# Patient Record
Sex: Female | Born: 1955 | Race: White | Hispanic: No | Smoking: Never smoker
Health system: Southern US, Community
[De-identification: ages and names within clinical notes are randomized; demographics above are authoritative.]

## PROBLEM LIST (undated history)

## (undated) DIAGNOSIS — R61 Generalized hyperhidrosis: Secondary | ICD-10-CM

## (undated) DIAGNOSIS — Z9889 Other specified postprocedural states: Secondary | ICD-10-CM

## (undated) DIAGNOSIS — Z86718 Personal history of other venous thrombosis and embolism: Secondary | ICD-10-CM

## (undated) DIAGNOSIS — D649 Anemia, unspecified: Secondary | ICD-10-CM

## (undated) DIAGNOSIS — F329 Major depressive disorder, single episode, unspecified: Secondary | ICD-10-CM

## (undated) DIAGNOSIS — K219 Gastro-esophageal reflux disease without esophagitis: Secondary | ICD-10-CM

## (undated) DIAGNOSIS — B009 Herpesviral infection, unspecified: Secondary | ICD-10-CM

## (undated) DIAGNOSIS — S92001A Unspecified fracture of right calcaneus, initial encounter for closed fracture: Secondary | ICD-10-CM

## (undated) DIAGNOSIS — I341 Nonrheumatic mitral (valve) prolapse: Secondary | ICD-10-CM

## (undated) DIAGNOSIS — R5383 Other fatigue: Secondary | ICD-10-CM

## (undated) DIAGNOSIS — R45851 Suicidal ideations: Secondary | ICD-10-CM

## (undated) DIAGNOSIS — R011 Cardiac murmur, unspecified: Secondary | ICD-10-CM

## (undated) DIAGNOSIS — I2699 Other pulmonary embolism without acute cor pulmonale: Secondary | ICD-10-CM

## (undated) DIAGNOSIS — G709 Myoneural disorder, unspecified: Secondary | ICD-10-CM

## (undated) DIAGNOSIS — F32A Depression, unspecified: Secondary | ICD-10-CM

## (undated) DIAGNOSIS — R635 Abnormal weight gain: Secondary | ICD-10-CM

## (undated) DIAGNOSIS — Z460 Encounter for fitting and adjustment of spectacles and contact lenses: Secondary | ICD-10-CM

## (undated) DIAGNOSIS — R112 Nausea with vomiting, unspecified: Secondary | ICD-10-CM

## (undated) HISTORY — DX: Unspecified fracture of right calcaneus, initial encounter for closed fracture: S92.001A

## (undated) HISTORY — DX: Cardiac murmur, unspecified: R01.1

## (undated) HISTORY — DX: Encounter for fitting and adjustment of spectacles and contact lenses: Z46.0

## (undated) HISTORY — DX: Personal history of other venous thrombosis and embolism: Z86.718

## (undated) HISTORY — DX: Morbid (severe) obesity due to excess calories: E66.01

## (undated) HISTORY — DX: Other pulmonary embolism without acute cor pulmonale: I26.99

## (undated) HISTORY — DX: Other fatigue: R53.83

## (undated) HISTORY — DX: Generalized hyperhidrosis: R61

## (undated) HISTORY — DX: Herpesviral infection, unspecified: B00.9

## (undated) HISTORY — DX: Abnormal weight gain: R63.5

## (undated) HISTORY — PX: OTHER SURGICAL HISTORY: SHX169

## (undated) HISTORY — DX: Gastro-esophageal reflux disease without esophagitis: K21.9

---

## 1999-03-02 ENCOUNTER — Other Ambulatory Visit: Admission: RE | Admit: 1999-03-02 | Discharge: 1999-03-02 | Payer: Self-pay | Admitting: *Deleted

## 1999-03-12 ENCOUNTER — Other Ambulatory Visit: Admission: RE | Admit: 1999-03-12 | Discharge: 1999-03-12 | Payer: Self-pay | Admitting: *Deleted

## 1999-03-12 ENCOUNTER — Encounter (INDEPENDENT_AMBULATORY_CARE_PROVIDER_SITE_OTHER): Payer: Self-pay | Admitting: Specialist

## 2000-02-24 ENCOUNTER — Other Ambulatory Visit: Admission: RE | Admit: 2000-02-24 | Discharge: 2000-02-24 | Payer: Self-pay | Admitting: *Deleted

## 2000-04-13 ENCOUNTER — Other Ambulatory Visit: Admission: RE | Admit: 2000-04-13 | Discharge: 2000-04-13 | Payer: Self-pay | Admitting: *Deleted

## 2000-05-02 HISTORY — PX: HYSTEROSCOPY: SHX211

## 2000-05-02 HISTORY — PX: LAPAROSCOPY: SHX197

## 2000-08-25 ENCOUNTER — Other Ambulatory Visit (HOSPITAL_COMMUNITY): Admission: RE | Admit: 2000-08-25 | Discharge: 2000-09-06 | Payer: Self-pay | Admitting: Psychiatry

## 2000-10-09 ENCOUNTER — Other Ambulatory Visit: Admission: RE | Admit: 2000-10-09 | Discharge: 2000-10-09 | Payer: Self-pay | Admitting: *Deleted

## 2001-03-02 HISTORY — PX: EYE SURGERY: SHX253

## 2001-05-02 HISTORY — PX: ABDOMINAL HYSTERECTOMY: SHX81

## 2002-01-02 ENCOUNTER — Other Ambulatory Visit: Admission: RE | Admit: 2002-01-02 | Discharge: 2002-01-02 | Payer: Self-pay | Admitting: *Deleted

## 2002-03-06 ENCOUNTER — Encounter (INDEPENDENT_AMBULATORY_CARE_PROVIDER_SITE_OTHER): Payer: Self-pay | Admitting: Specialist

## 2002-03-06 ENCOUNTER — Observation Stay (HOSPITAL_COMMUNITY): Admission: RE | Admit: 2002-03-06 | Discharge: 2002-03-07 | Payer: Self-pay | Admitting: *Deleted

## 2002-07-19 ENCOUNTER — Emergency Department (HOSPITAL_COMMUNITY): Admission: EM | Admit: 2002-07-19 | Discharge: 2002-07-20 | Payer: Self-pay | Admitting: Emergency Medicine

## 2003-01-17 ENCOUNTER — Encounter: Payer: Self-pay | Admitting: Neurological Surgery

## 2003-01-17 ENCOUNTER — Ambulatory Visit (HOSPITAL_COMMUNITY): Admission: RE | Admit: 2003-01-17 | Discharge: 2003-01-17 | Payer: Self-pay | Admitting: Anesthesiology

## 2003-01-21 ENCOUNTER — Encounter (INDEPENDENT_AMBULATORY_CARE_PROVIDER_SITE_OTHER): Payer: Self-pay | Admitting: *Deleted

## 2003-01-21 ENCOUNTER — Ambulatory Visit (HOSPITAL_COMMUNITY): Admission: RE | Admit: 2003-01-21 | Discharge: 2003-01-21 | Payer: Self-pay | Admitting: Neurological Surgery

## 2003-01-28 ENCOUNTER — Ambulatory Visit (HOSPITAL_COMMUNITY): Admission: RE | Admit: 2003-01-28 | Discharge: 2003-01-28 | Payer: Self-pay | Admitting: Unknown Physician Specialty

## 2003-02-04 ENCOUNTER — Ambulatory Visit (HOSPITAL_COMMUNITY): Admission: RE | Admit: 2003-02-04 | Discharge: 2003-02-04 | Payer: Self-pay | Admitting: Unknown Physician Specialty

## 2003-02-04 ENCOUNTER — Encounter: Payer: Self-pay | Admitting: Unknown Physician Specialty

## 2003-02-11 ENCOUNTER — Ambulatory Visit (HOSPITAL_COMMUNITY): Admission: RE | Admit: 2003-02-11 | Discharge: 2003-02-11 | Payer: Self-pay | Admitting: Neurological Surgery

## 2003-02-11 ENCOUNTER — Encounter: Payer: Self-pay | Admitting: Neurological Surgery

## 2003-02-18 ENCOUNTER — Ambulatory Visit (HOSPITAL_COMMUNITY): Admission: RE | Admit: 2003-02-18 | Discharge: 2003-02-18 | Payer: Self-pay | Admitting: Neurological Surgery

## 2003-02-18 ENCOUNTER — Encounter: Payer: Self-pay | Admitting: Neurological Surgery

## 2003-03-11 ENCOUNTER — Ambulatory Visit (HOSPITAL_COMMUNITY): Admission: RE | Admit: 2003-03-11 | Discharge: 2003-03-11 | Payer: Self-pay | Admitting: Unknown Physician Specialty

## 2003-03-18 ENCOUNTER — Ambulatory Visit (HOSPITAL_COMMUNITY): Admission: RE | Admit: 2003-03-18 | Discharge: 2003-03-18 | Payer: Self-pay | Admitting: Neurological Surgery

## 2004-05-02 DIAGNOSIS — S92001A Unspecified fracture of right calcaneus, initial encounter for closed fracture: Secondary | ICD-10-CM

## 2004-05-02 HISTORY — DX: Unspecified fracture of right calcaneus, initial encounter for closed fracture: S92.001A

## 2004-07-17 ENCOUNTER — Inpatient Hospital Stay (HOSPITAL_COMMUNITY): Admission: RE | Admit: 2004-07-17 | Discharge: 2004-07-19 | Payer: Self-pay | Admitting: Psychiatry

## 2004-07-17 ENCOUNTER — Ambulatory Visit: Payer: Self-pay | Admitting: Psychiatry

## 2005-02-03 ENCOUNTER — Emergency Department (HOSPITAL_COMMUNITY): Admission: EM | Admit: 2005-02-03 | Discharge: 2005-02-03 | Payer: Self-pay | Admitting: *Deleted

## 2005-02-10 ENCOUNTER — Inpatient Hospital Stay (HOSPITAL_COMMUNITY): Admission: RE | Admit: 2005-02-10 | Discharge: 2005-02-13 | Payer: Self-pay | Admitting: Orthopedic Surgery

## 2005-03-07 ENCOUNTER — Inpatient Hospital Stay (HOSPITAL_COMMUNITY): Admission: RE | Admit: 2005-03-07 | Discharge: 2005-03-10 | Payer: Self-pay | Admitting: Orthopedic Surgery

## 2006-04-26 ENCOUNTER — Other Ambulatory Visit: Admission: RE | Admit: 2006-04-26 | Discharge: 2006-04-26 | Payer: Self-pay | Admitting: *Deleted

## 2007-05-03 DIAGNOSIS — I2699 Other pulmonary embolism without acute cor pulmonale: Secondary | ICD-10-CM

## 2007-05-03 HISTORY — DX: Other pulmonary embolism without acute cor pulmonale: I26.99

## 2007-05-09 ENCOUNTER — Other Ambulatory Visit: Admission: RE | Admit: 2007-05-09 | Discharge: 2007-05-09 | Payer: Self-pay | Admitting: *Deleted

## 2008-02-21 ENCOUNTER — Telehealth: Payer: Self-pay | Admitting: Gastroenterology

## 2008-02-23 ENCOUNTER — Inpatient Hospital Stay (HOSPITAL_COMMUNITY): Admission: EM | Admit: 2008-02-23 | Discharge: 2008-02-27 | Payer: Self-pay | Admitting: Emergency Medicine

## 2008-02-23 ENCOUNTER — Ambulatory Visit: Payer: Self-pay | Admitting: *Deleted

## 2008-02-23 ENCOUNTER — Ambulatory Visit: Payer: Self-pay | Admitting: Internal Medicine

## 2008-02-25 ENCOUNTER — Ambulatory Visit: Payer: Self-pay | Admitting: Vascular Surgery

## 2008-02-25 ENCOUNTER — Encounter (INDEPENDENT_AMBULATORY_CARE_PROVIDER_SITE_OTHER): Payer: Self-pay | Admitting: Internal Medicine

## 2008-02-29 ENCOUNTER — Emergency Department (HOSPITAL_COMMUNITY): Admission: EM | Admit: 2008-02-29 | Discharge: 2008-02-29 | Payer: Self-pay | Admitting: Emergency Medicine

## 2008-08-18 ENCOUNTER — Encounter: Admission: RE | Admit: 2008-08-18 | Discharge: 2008-08-18 | Payer: Self-pay | Admitting: Family Medicine

## 2010-09-14 NOTE — Consult Note (Signed)
NAMEDAYSHIA, [name]:  [id number]   MEDICAL RECORD NO.:  [id number]          PATIENT TYPE:  INP   LOCATION:  6708                         FACILITY:  [facility]   PHYSICIAN:  Glennie Isle, MD   DATE OF BIRTH:  03/20/1956   DATE OF CONSULTATION:  02/24/2008  DATE OF DISCHARGE:                                 CONSULTATION   PRIMARY DOCTOR:  Dr. Doug Sou.   CHIEF COMPLAINT:  Shortness of breath.   HISTORY OF PRESENT ILLNESS:  The patient is a 56 year old obese female  with a 1-day history of shortness of breath.  She denies any chest pain,  nausea, vomiting, dizziness.  She states that the shortness of breath  started yesterday and worsened later in the afternoon.  In the ER, she  was noted to have an elevated troponin and elevated D-dimer.  She had a  CT PE protocol done demonstrating bilateral extensive pulmonary emboli.  The patient states that she has noted some swelling and edema in her  right lower extremity.  She denies any recent travel.  She does admit to  a sedentary lifestyle given that she does have some problems with her  right ankle.   PAST MEDICAL HISTORY:  1. Mitral valve prolapse.  2. History of PVC 10 years ago controlled with metoprolol.  3. Depression.  4. Hypertension.  5. GERD.  6. History of open reduction internal fixation of the right posterior      calcaneus in November 2006.   ALLERGIES:  No known drug allergies.   MEDICATIONS:  Metoprolol.  Cymbalta.  Omeprazole.  Wellbutrin.  Xanax.  Mirtazapine.   SOCIAL HISTORY:  Denies any tobacco, alcohol.  She states that she  smoked when she was ages 68 through 67.   FAMILY HISTORY:  Unknown.  The patient is adopted.   REVIEW OF SYSTEMS:  A thorough 14-point review of systems was performed  and was negative, except as noted per HPI.   PHYSICAL EXAM:  VITAL SIGNS:  Temperature 98.3, pulse 91, respiratory  rate 20.  Blood pressure 135/55, satting 91% on 3L nasal cannula.  GENERAL APPEARANCE:  In no apparent distress.  The patient is on nasal  cannula.  HEENT:  Pupils equal, round, and reactive to light.  Extraocular  movements intact.  Oropharynx clear.  NECK:  Large, though supple and no thyromegaly, JVD, or bruits  appreciated.  CARDIOVASCULAR:  S1, S2 normal.  No murmurs or rubs heard.  LUNGS:  Clear to auscultation bilaterally.  No crackles are heard.  SKIN:  There is a mild right ankle erythema.  ABDOMEN:  Soft, nontender, nondistended with bowel sounds.  The patient  is obese.  EXTREMITIES:  The patient has 1 to 2+ bilateral edema, left greater than  right.  There is mild right ankle tenderness to palpation.  MUSCULOSKELETAL:  There are no joint deformities noted.  NEUROLOGIC:  Alert and oriented x3.  Cranial nerves 2-12 are intact.  Motor exam is 5/5 upper extremity and lower extremity.   RADIOLOGY:  CT PE protocol showed extensive bilateral PE with areas of  pulmonary infarction.  The patient has a 3.2 mm left upper lobe nodule.   EKG was pending.   LABS:  Show a white count of 15.1, hemoglobin 11.9, platelets 316,000.  BMP showed a BUN of 8, creatinine 1.02, with a potassium of 3.9.  INR  was 1.0.  D-dimer was elevated at 2.62.  BNP was 297.  CK-MB was 4.7,  troponin 0.67 up from point of care troponin of 0.21.   ASSESSMENT AND PLAN:  Elevated troponin.  This is most likely secondary  to bilateral pulmonary emboli rather than acute myocardial infarction.  I do not suspect an unstable plaque.  The patient does not really have  any significant risk factors, although there is a question of  hypertension, but the patient does deny.  I agree with primary team's  assessment with an echocardiogram in the morning.  Will check a TSH as  well.  I do expect to see the cardiac enzymes trend down.  Would  continue heparin, Coumadin, and initiate aspirin 81 mg daily, and check  an EKG.  Thank you for this consultation.      Glennie Isle, MD   Electronically Signed     SS/MEDQ  D:  02/24/2008  T:  02/24/2008  Job:  [id number]

## 2010-09-14 NOTE — H&P (Signed)
[name], [name]:  [id number]   MEDICAL RECORD NO.:  [id number]          PATIENT TYPE:  INP   LOCATION:  6708                         FACILITY:  [facility]   PHYSICIAN:  Joylene John, MD       DATE OF BIRTH:  [birth year]   DATE OF ADMISSION:  02/23/2008  DATE OF DISCHARGE:                              HISTORY & PHYSICAL   CHIEF COMPLAINT:  This is a 55 year old obese white female coming in  with shortness of breath for about 1 day.   HISTORY OF PRESENT ILLNESS:  A 55 year old obese white female  complaining of shortness of breath, which started yesterday afternoon.  According to the patient, she was walking from one room to another and  experienced shortness of breath.  According to the patient, this was  very unusual for her.  Over the course of the day, her shortness of  breath got worse and at night when she was sitting in a chair, she had a  squeezing sensation in her chest resulting in difficulty breathing.  The  patient denied any chest pain, nausea, vomiting, or dizziness associated  with her shortness of breath, however, did report some chills yesterday.  The patient denied any cough or hemoptysis also.  The patient called the  PCP this morning and was told to come to the emergency room for further  management.  The patient's first set of cardiac enzymes showed elevated  troponin and elevated D-dimers.  The patient had a CT of chest, which  showed extensive bilateral PE and patchy areas of atelectasis reflective  of pulmonary infarction.  There was also a left upper lobe nodule, 3.2  mm which was noted on chest CT.  The patient denies taking any oral  contraceptives or any recent travels requiring hours of sitting.  The  patient did notice that earlier this week on Wednesday she had some  redness and swelling on her right lower extremity at the site of her  surgical scar.  The patient told me that she applied some hydrocortisone  cream to relieve the  redness and the discomfort.  The patient denies any  similar episodes of lower extremity skin or leg swelling in the past.  The patient is up to date with her Pap, mammogram, and colonoscopy.   PAST MEDICAL HISTORY:  Significant for anxiety, depression, obesity, and  hypertension.   SOCIAL HISTORY:  The patient is a nonsmoker and nondrinker.  No drug  use.   FAMILY HISTORY:  Unknown since the patient is adopted.   The patient's home medications include the following:  1. Metoprolol XL 50 mg once daily.  2. Cymbalta 60 mg once daily.  3. Prilosec 20 mg once daily.  4. Wellbutrin 300 mg once daily.  5. Remeron 45 mg once daily.  6. Xanax 1 mg 4 times a day.   The patient's allergies are listed as no known allergies   REVIEW OF SYSTEMS:  Please refer to the HPI, otherwise the 14-point  review of system was negative.   PHYSICAL EXAMINATION:  VITAL SIGNS:  Temperature of 98.3, blood pressure  of 135/55, pulse of 91, and respirations 20.  The patient was sating  about 91-92% on 3 liters of nasal cannula.  GENERAL:  This is an obese white female in no acute distress, able to  talk in full sentences, awake and alert.  HEENT:  No oral lesions noted.  NECK:  Supple.  No lymph nodes are appreciated.  Sclera is anicteric.  LUNGS:  Clear to auscultation.  CARDIOVASCULAR:  Regular rate and rhythm.  No crackles heard at the  bases.  LOWER EXTREMITY:  With minimal right lower extremity redness of the skin  on the lateral malleolus.  No other findings on physical exam.   LABORATORY DATA:  Lab values show pertinent values of white count of  15.1, hemoglobin 11.9, hematocrit 37.9, and platelets 316.  First set of  cardiac enzymes showed a troponin I of 0.28 which was elevated and CK-MB  of 7.8.  Chemistry showed a sodium of 138, potassium 3.9, chloride 102,  bicarb 26, BUN 8, creatinine 1.02, and glucose 119.  UA was cloudy with  trace leukocyte esterase, rest was negative.  CT of the chest  showed  extensive bilateral pulmonary embolus and a patchy area for atelectasis  reflective of pulmonary infarct and a left upper lobe nodule which was  3.2 mm, which requires a followup CT in 1 year.   ASSESSMENT AND PLAN:  A 55 year old obese white female coming in with  shortness of breath for 1 day, found to have extensive pulmonary  embolism on CT.  Plan is to admit the patient to telemetry and do serial  cardiac enzymes to rule her out since her first set of troponin was  elevated, although this could be explained by extensive pulmonary  embolism.  Heparin drip and Coumadin for anticoagulation per pharmacy  protocol.  We will get an echocardiogram in the morning.  We will get  blood cultures and urine cultures since the patient has increased white  count to make sure we are not missing a fever source, although the white  count could also be explained by the stress from the pulmonary embolism.  The patient will be ready for discharge when she is stable and her INR  is therapeutic.  One possibility for early discharge is the patient is  stable and INR is not therapeutic as that she can be discharged on subcu  Lovenox as a bridging therapy with INR which can be monitored outpatient  mid-week by the PCP.  The patient's husband is a diabetic and is used to  giving insulin shots and so he can be willing to be trained to give  Lovenox shot if the patient is ready for discharge over the weekend.      Joylene John, MD  Electronically Signed     RP/MEDQ  D:  02/23/2008  T:  02/24/2008  Job:  [id number]

## 2010-09-17 NOTE — Op Note (Signed)
NAMEKAMARRI, FISCHETTI NO.:  [id number]   MEDICAL RECORD NO.:  [id number]          PATIENT TYPE:  INP   LOCATION:  5016                         FACILITY:  [facility]   PHYSICIAN:  Nadara Mustard, MD     DATE OF BIRTH:  [birth year]   DATE OF PROCEDURE:  03/07/2005  DATE OF DISCHARGE:                                 OPERATIVE REPORT   PREOPERATIVE DIAGNOSIS:  Wound breakdown, right posterior calcaneus, status  post open reduction and internal fixation of calcaneus with osteomyelitis  and failure of fixation.   POSTOPERATIVE DIAGNOSIS:  Wound breakdown, right posterior calcaneus, status  post open reduction and internal fixation of calcaneus with osteomyelitis  and failure of fixation.   PROCEDURE:  1.  Partial calcaneal excision.  2.  Irrigation and debridement of skin, soft tissue and bone.  3.  Gastrocnemius recession.  4.  Advancement of the Achilles tendon.  5.  Z-plasty wound closure.  6.  Application of wound vacuum-assisted closure.   SURGEON:  Nadara Mustard, MD   ANESTHESIA:  General.   ESTIMATED BLOOD LOSS:  Minimal.   ANTIBIOTICS:  One gram of Kefzol.   DRAINS:  None.   COMPLICATIONS:  None.   TOURNIQUET TIME:  None.   DISPOSITION:  To PACU in stable condition.   INDICATION FOR PROCEDURE:  The patient is a 55 year old woman who is 4 weeks  status post ORIF of a tongue-type calcaneus fracture through an extensile  lateral incision.  Initially postoperatively, the patient's incision had  healed nicely and the fracture was stable; however, she had progressive loss  of reduction with elevation of the calcaneal fragment.  This fragment caused  skin breakdown and necrosis posteriorly and the patient had a 50 x 50-mm  area of wound necrosis posteriorly from the failure of fixation.  Her  lateral incision had healed nicely.  She is 3 weeks out from her initial  fixation and presents at this time for revision internal fixation as  mentioned above.   Risks and benefits were discussed including persistent  infection, neurovascular injury, nonhealing of the wound, need for  additional surgery.  The patient states she understands and wishes to  proceed at this time.   DESCRIPTION OF PROCEDURE:  The patient was brought to OR room 4 and  underwent a general anesthetic.  After an adequate level of anesthesia was  obtained, the patient was placed prone on the operating table and all bony  prominences were padded and the patient's right lower extremity was first  scrubbed with Betadine scrub and paint; this was dried and then she was  prepped into a sterile field using DuraPrep.  The 50 x 50-mm area of skin  necrosis was debrided posteriorly.  The deep retained hardware was removed  and the calcaneal fragment was then excised.  The wound was irrigated with  normal saline; there were good viable wound edges.  A proximal incision was  then made and a gastrocnemius recession was performed.  This allowed for  advancement of the Achilles.  The Achilles was advanced into the calcaneus  and this  was secured using #2 FilterWire.  With the posterior incision, a Z-  plasty closure was performed and the wound edges were approximated with the  foot plantarflexed with an Allgower suture of 2-0 nylon with no sutures  crossing the posterior flap.  With the foot in plantarflexion, the leg was  covered with Ioban dressing.  This was then opened over the incision,  Adaptic was applied and a wound V.A.C. was applied on top.  The wound V.A.C.  was set to 75 mm of suction and Adaptic and a dry dressing with 4 x 4's and  Coban were applied to the gastrocnemius recession.  The patient was then  extubated and taken to the PACU in stable condition.  Plan for a wound  V.A.C. therapy, antibiotics and discharge to home once the wound shows good  healing.      Nadara Mustard, MD  Electronically Signed     MVD/MEDQ  D:  03/07/2005  T:  03/08/2005  Job:  [id number]

## 2010-09-17 NOTE — Op Note (Signed)
NAMEJALENE, [name]:  [id number]   MEDICAL RECORD NO.:  [id number]                   PATIENT TYPE:  AMB   LOCATION:  DAY                                  [facility]ACILITY:  [facility]   PHYSICIAN:  Almedia Balls. Fore, M.D.                DATE OF BIRTH:  [birth year]   DATE OF PROCEDURE:  03/06/2002  DATE OF DISCHARGE:                                 OPERATIVE REPORT   PREOPERATIVE DIAGNOSES:  1. Abnormal uterine bleeding.  2. Anemia.  3. History of endometriosis.   POSTOPERATIVE DIAGNOSES:  1. Abnormal uterine bleeding.  2. Anemia.  3. History of endometriosis.   PROCEDURE:  Abdominal supracervical hysterectomy.   SURGEON:  Almedia Balls. Randell Patient, M.D.   ASSISTANT:  Leona Singleton, M.D.   ANESTHESIA:  General orotracheal.   INDICATIONS:  The patient is a 55 year old with the above noted problems who  was counseled as to the need for surgery to treat these problems and the  type of surgery to be performed.  She was fully counseled as to the nature  of the procedure and the risks involved to include risks of anesthesia,  injury to bowel, bladder, blood vessels, ureters, postoperative hemorrhage,  infection, recuperation and possible use of hormone therapy should her  ovaries be removed.  She fully understands all of these considerations and  wishes to proceed on March 06, 2002.   FINDINGS:  On entry into the abdomen, there were some adhesions involving  the omentum to the anterior peritoneal services, particularly in the right  upper quadrant around the liver and gallbladder.  Palpation of the upper  abdominal viscera revealed it to be all normal.  The appendix appeared  normal.  The uterus was mid posterior and top normal size and somewhat soft.  The tubes had been previously surgically interrupted for sterilization  attempt.  The ovaries appeared to be normal.   DESCRIPTION OF PROCEDURE:  With the patient under general anesthesia,  prepared and draped in  the usual sterile fashion, with the Foley catheter in  the bladder, a lower abdominal transverse incision was made after excising  the previous surgical scar.  The incision was continued into the peritoneal  cavity without difficulty.  Self-retaining retractor was placed, and the  bowel was packed off.  Kelly clamps were used to clamp the utero-ovarian  anastomosis, tubes and round ligaments bilaterally for traction and  hemostasis.  The round ligaments were then transected with Bovie  electrocoagulation with development of the bladder flap anteriorly and entry  into the retroperitoneal and paravesical spaces.  Because of the normal  appearance of the ovaries it was felt that these could be retained.  Accordingly, Haney clamps were placed proximal to the ovaries bilaterally  with transection of the utero-ovarian anastomosis and tubes.  The pedicles  were then doubly tied with 1 chromic catgut.  The uterine vessels were  skeletonized bilaterally,  clamped, cut and suture ligated with 1 chromic  catgut.  Cardinal ligaments were likewise clamped bilaterally, cut, and  suture ligated with 1 chromic catgut.  It is impossible to excise the  uterine fundus using Bovie electrocoagulation.  The cervix was also removed.  The cervical stump was reapproximated and rendered hemostatic with  interrupted figure-of-eight sutures of #1 chromic catgut.  The area was then  lavaged with copious amounts of lactated Ringers solution, and after noting  that hemostasis was maintained, the area was reperitonealized with  interrupted suture of 1 chromic catgut.  The ovaries were well elevated out  of the pelvis, so suspension of these structures was not necessary.  At this  point, with correct sponge and instrument count and good hemostasis, the  peritoneum was closed with continuous suture of 0 Vicryl.  The fascia was  closed with two sutures of 0 Vicryl which were brought from the lateral  aspects of the incision  and tied separately in the midline.  Subcutaneous  fat was reapproximated with interrupted sutures of 0 Vicryl.  The skin was  closed with a subcuticular suture of 3-0 plain catgut.  Estimated blood loss  150 ml.  The patient was taken to the recovery room in good condition with  clear urine in the Foley catheter tubing.  She will be placed on 23 hour  observation following surgery.                                               Almedia Balls. Randell Patient, M.D.    SRF/MEDQ  D:  03/06/2002  T:  03/06/2002  Job:  [id number]   cc:   Leona Singleton, M.D.  [address]  [place]  [place] [address]  Fax: [phone]

## 2010-09-17 NOTE — Discharge Summary (Signed)
NAMEMAEKAYLA, [name]:  [id number]   MEDICAL RECORD NO.:  [id number]          PATIENT TYPE:  INP   LOCATION:  5023                         FACILITY:  [facility]   PHYSICIAN:  Nadara Mustard, MD     DATE OF BIRTH:  1955-06-16   DATE OF ADMISSION:  02/10/2005  DATE OF DISCHARGE:  02/13/2005                                 DISCHARGE SUMMARY   DIAGNOSIS:  Tongue-type right calcaneal fracture.   PROCEDURE:  Open reduction/internal fixation right calcaneus. Discharged to  home in stable condition. Prescription for Tylox. Plan to follow-up in the  office and 1-2 weeks.   HISTORY OF PRESENT ILLNESS:  The patient is a 55 year old woman who  sustained a tongue-type right calcaneus fracture. The patient was  stabilized. The patient was placed in a compressive wrap to decrease the  swelling and she presents at this time for internal fixation. The patient's  hospital course was essentially unremarkable. She underwent ORIF of the  right calcaneus fracture. She received Kefzol for infection prophylaxis and  a tourniquet was not used.   Postoperatively, the patient was started on physical therapy with  progressive ambulation, nonweightbearing on the right. She was started on  aspirin for DVT prophylaxis. The patient progressed well with ambulation.  She was safe with ambulation with nonweightbearing on the right and she was  discharged to home in stable condition on February 13, 2005 with follow-up in  the office in 2 weeks. She was to continue with her aspirin for DVT  prophylaxis and Tylox for pain.      Nadara Mustard, MD  Electronically Signed     MVD/MEDQ  D:  04/13/2005  T:  04/13/2005  Job:  [id number]

## 2010-09-17 NOTE — H&P (Signed)
[name], [name]:  [id number]   MEDICAL RECORD NO.:  [id number]          PATIENT TYPE:  IPS   LOCATION:  0506                          FACILITY:  [facility]   PHYSICIAN:  Geoffery Lyons, M.D.      DATE OF BIRTH:  09-06-1955   DATE OF ADMISSION:  07/17/2004  DATE OF DISCHARGE:                         PSYCHIATRIC ADMISSION ASSESSMENT   IDENTIFYING INFORMATION:  This is a 55 year old married white female.  She  is admitted voluntarily to the services of Dr. Geoffery Lyons.   HISTORY OF PRESENT ILLNESS:  Apparently the patient lost her job on Friday,  the day before had been the 4th anniversary of her father's death, and she  had not been sleeping well this past week due to the upcoming anniversary of  her father's death and then being let go.  She emailed a friend and  indicated that she was going to say goodbye to her sons.  This friend  happens to work for a crisis line and called the police.  The police showed  up at her home yesterday, and she volunteered to come here to the behavioral  health center for clearance.  She states that she is not actually suicidal.  He husband was concerned that she had not conveyed to him exactly how upset  she was about losing her job etc.   PAST PSYCHIATRIC HISTORY:  Four years ago after the sudden death of her  father, the patient did go to IOP here for 2 weeks.  She found it quite  helpful.  She has been a patient of Dr. [name] since then.   SOCIAL HISTORY:  This is her third marriage.  She has been married to this  husband 5 years now.  Her second marriage was the love of her life.  They  were together for 7-8 years; however, he left the marriage for someone who  was about 10 years younger than the patient.  She has two sons, ages 57 and  61.  She states this is the third time that she has been laid off in about 7-  8 years.   FAMILY HISTORY:  The patient is adopted.   ALCOHOL OR DRUG HISTORY:  None.   MEDICAL  HISTORY AND PRIMARY CARE [name]:  Dr. Catha Gosselin, and she is  prescribed no medications by him.  She is followed by Dr. [name]  psychiatrically.  Medical problems are her obesity and insomnia.   MEDICATIONS:  1.  Sonata 20 mg at h.s.  2.  Klonopin 2 mg at h.s.  3.  Cymbalta 60 mg at h.s.  4.  Nexium 40 mg p.o. daily  5.  Toprol XL 50 mg daily for arrhythmia.   DRUG ALLERGIES:  She is allergic to [facility], but I am not sure what  happens.   PHYSICAL EXAMINATION:  She is obese, otherwise, her physical examination was  unremarkable.   MENTAL STATUS EXAM:  She is alert and oriented x 3.  She is appropriately  groomed, dressed and nourished.  Her speech was not pressured.  Her mood is  somewhat depressed  and anxious.  Her affect is congruent; however, it is  appropriate to the situation.  Thought processes are clear, rational and  goal-oriented.  She is requesting discharge, feels that she just needs to go  to a family therapist to communicate better with her current husband.  Judgment and insight seem to be intact.  Concentration and memory are  intact.  Intelligence is at least average.  She denies suicide or homicidal  ideation.  She denies auditory or visual hallucinations.   ADMISSION DIAGNOSES:   AXIS I:  Major depressive disorder, recurrent.   AXIS II:  Deferred.   AXIS III:  1.  History of pseudotumor, right eye; however, this has been ruled out by      Dr. Ermalene Searing at [facility], a neuro-ophthalmologist  2.  She is status post hysterectomy 3 years ago.   AXIS IV:  Occupational problem, just laid off again.   AXIS V:  35.   PLAN:  Continue her medications.  She works quite closely with Dr. [name].  Indeed she has an upcoming appointment regarding her medications, and  we will have case management try to identify an appropriate family and  marital therapist.   DIAGNOSTIC STUDIES:  Her labs show that she has a slight infection.  WBC is  15,000.   Her urinalysis is pending.  Her glucose is slightly elevated at  100, and her alkaline phosphatase is also slightly elevated at 172.  Other  tests are pending.  However, her TSH is available, and it is within range at  4.070.  Once we get the results of her UA, if she needs treatment we will  treat that.      MD/MEDQ  D:  07/18/2004  T:  07/18/2004  Job:  [id number]

## 2010-09-17 NOTE — Discharge Summary (Signed)
[name], SCHIFANO.:  [id number]   MEDICAL RECORD NO.:  [id number]          PATIENT TYPE:  INP   LOCATION:  6708                         FACILITY:  [facility]   PHYSICIAN:  Ramiro Harvest, MD    DATE OF BIRTH:  [birth year]   DATE OF ADMISSION:  02/23/2008  DATE OF DISCHARGE:  02/27/2008                               DISCHARGE SUMMARY   PATIENT'S PRIMARY CARE PHYSICIAN:  Dr. Catha Gosselin of [facility].   DISCHARGE DIAGNOSES:  1. Bilateral pulmonary embolism/left lower extremity deep venous      thrombosis/right superficial thrombus.  2. Elevated troponins.  3. Leukocytosis.  4. Depression/anxiety.  5. Hypertension.  6. Obesity.   DISCHARGE MEDICATIONS:  1. Coumadin 5 mg p.o. q.h.s.  2. Metoprolol ER 50 mg p.o. daily.  3. Cymbalta 60 mg p.o. daily.  4. Omeprazole 20 mg p.o. daily.  5. Wellbutrin 300 mg p.o. daily.  6. Remeron 45 mg p.o. q.h.s.  7. Xanax 1 mg q.i.d.   DISPOSITION AND FOLLOWUP:  Patient will be discharged home.  Patient is  to follow up with PCP's office on Friday, February 29, 2008, for a PT/INR  check.  Patient is then to follow up with PCP in 1 week for hospital  followup.  Patient will likely need 6 months to a year of  anticoagulation as this is her first episode of a thrombus/embolus.   CONSULTATIONS DONE:  A cardiology consult was done.  Patient was seen in  consultation by Dr. Lewayne Bunting of [facility] on February 24, 2008.   PROCEDURES PERFORMED:  1. A chest x-ray was performed on February 23, 2008, that showed      cardiac enlargement and pulmonary venous congestion and right      middle lobe atelectasis.  2. CT angiogram of the chest was done on February 23 2008, that showed      extensive bilateral pulmonary embolus, patchy areas of atelectasis      and airspace disease likely reflects areas of pulmonary infarction,      left upper lobe nodule measures 3.2 mm.  If the patient is a      smoker, has risk factors  for bronchogenic carcinoma.  Followup      chest CT at a year is recommended.  His recommendation follows the      consensus statement.  3. A 2D echo was obtained on February 25, 2008, which showed normal      overall left ventricular systolic function, EF of 60% to 65%.  4. Lower extremity venous Dopplers were obtained on February 25, 2008,      that showed in the right lower extremity was positive for      superficial thrombus in the lesser saphenous vein and distal      greater saphenous vein.  No evidence of DVT or Baker cyst.  Left      lower extremity was positive for DVT in the popliteal vein.  No      evidence of superficial thrombus or Baker cyst.   BRIEF ADMISSION HISTORY AND PHYSICAL:  [name] is  a 55-year-  old, white, obese female who presented with complaints of shortness of  breath which started 1 day prior to admission.  According to the  patient, she was walking from one room to another and experienced some  shortness of breath.  According to the patient, this was very unusual  for her.  Over the course of the day, her shortness of breath got worse  and at night when she was sitting in the chair she had a squeezing  sensation in her chest resulting in difficulty breathing.  Patient  denied any chest pain.  No nausea.  No vomiting.  No dizziness  associated with her shortness of breath, however, did report some  chills.  Patient denied any cough or hemoptysis.  Patient called the PCP  on the morning of admission and was told to present to the ED for  further evaluation.  First set of cardiac enzymes showed elevated  troponin and elevated D-dimer.  The patient had a CT of the chest done  which showed extensive bilateral pulmonary embolism and patchy areas of  atelectasis reflective of pulmonary infarction.  There was also a left  upper lobe nodule 3.2 mm noted on chest CT.  Patient denies taking any  oral contraceptives or any recent travels requiring hours of  sitting.  Patient did notice early on in the week she had some redness and  swelling in her right lower extremity at the site of her surgical scar.  Patient told the admitting physician that she applied some  hydrocortisone cream to relieve the redness and discomfort.  Patient  denied any similar episodes of lower extremity skin or leg swelling in  the past.  Patient has been up-to-date with her Paps, mammogram, and her  colonoscopy.   PHYSICAL EXAM:  Per admitting physician, temperature 98.3, blood  pressure 135/55, pulse of 91, respirations 20, saturating 91% to 92% on  3 L nasal cannula.  GENERAL:  Patient is obese, white female, no acute distress, able to  speak in full sentences, awake and alert.  HEENT:  Normocephalic, atraumatic.  Pupils equal, round, and reactive to  light and accommodation.  Extraocular movements intact.  Oropharynx was  clear.  No lesions.  No exudates.  NECK:  Supple.  No lymphadenopathy.  Sclerae was anicteric.  LUNGS:  Clear to auscultation bilaterally.  No wheezes, no crackles, no  rhonchi.  CARDIOVASCULAR:  Regular rate and rhythm.  No murmurs, rubs, or gallops.  ABDOMEN:  Soft, nondistended.  Positive bowel sounds.  EXTREMITIES:  Minimal right lower extremity redness of the skin on the  lateral malleolus, otherwise within normal limits.   ADMISSION LABS:  White count 15.1, hemoglobin 11.9, hematocrit 37.9,  platelets of 316.  First set of cardiac enzymes showed a troponin of  0.28 which was elevated and a CK-MB of 7.8, sodium of 138, potassium  3.9, chloride 102, bicarb 26, BUN 8, creatinine 1.02, glucose of 119.  UA was cloudy with trace leukocyte esterase, otherwise was negative.  CT  of the chest as stated above.   HOSPITAL COURSE:  1. Bilateral pulmonary embolism/left lower extremity DVT/right      superficial thrombus.  Patient was admitted for a PE.  Patient was      placed on a heparin drip, as well as started on Coumadin.  Cardiac       enzymes were cycled.  A 2D echo was also obtained with results as      stated above, was negative  for any LV dysfunction or right      ventricular heart strain.  Lower extremity Dopplers were also      obtained with results as stated above which was positive for a left      lower extremity DVT in the popliteal vein.  Patient was continued      on heparin, as well as her Coumadin.  Heparin was then discontinued      and patient was started on  Lovenox bridge twice a day.  Patient      was monitored.  INR was monitored.  INR was therapeutic.  Patient      had been on Lovenox for at least a minimum of 5 days.  Her INR      remained in therapeutic range for 48 hours prior to discharge.      Patient was discharged home on Coumadin without close followup on      Friday February 29, 2008, for INR check and follow up with her PCP.      Patient improved symptomatically throughout the hospitalization and      by day of discharge patient was in stable and improved condition.  2. Elevated troponins.  On admission, patient was noted to have      elevated troponins.  Cardiology was consulted.  It was felt that      patient's elevated troponins were likely secondary to a bilateral      pulmonary embolism.  A 2D echo was obtained with results as stated      above which was within normal limits.  It was felt by cardiology      that no further cardiac workup was needed and patient was      discharged in stable condition.  3. Leukocytosis.  Patient was noted to have a leukocytosis on      admission.  No source of infection was noted.  Patient remained      afebrile throughout the hospitalization.  Urinalysis with urine      cultures were negative.  Chest x-ray was negative for any acute      infiltrate.  Blood cultures were also negative as well and patient      was discharged in stable condition.  The rest of patient's chronic      medical issues were stable throughout the hospitalization and      patient  was discharged in stable and improved condition.   On day of discharge, patient was in stable condition.   VITAL SIGNS ON DAY OF DISCHARGE:  Temperature of 98.3.  Pulse of 84.  Respirations 18.  Blood pressure 111/71.  Saturating 95% on 2 L nasal  cannula.   DISCHARGE LABS:  Sodium of 141, potassium 4.2, chloride 107, bicarb 25,  glucose 103, BUN 8, creatinine 0.93, and a calcium of 8.6, PT of 25.5,  INR of 2.2.  CBC with a white count of 11.6, hemoglobin 11.4, hematocrit  34.1, platelets of 353.   It was a pleasure taking care of [name]      Ramiro Harvest, MD  Electronically Signed     DT/MEDQ  D:  06/16/2008  T:  06/16/2008  Job:  [id number]   cc:   Caryn Bee L. Little, M.D.  Doylene Canning. Ladona Ridgel, MD

## 2010-09-17 NOTE — Op Note (Signed)
NAMETANAKA, GILLEN NO.:  [id number]   MEDICAL RECORD NO.:  [id number]          PATIENT TYPE:  INP   LOCATION:  2899                         FACILITY:  [facility]   PHYSICIAN:  Nadara Mustard, MD     DATE OF BIRTH:  04/09/1956   DATE OF PROCEDURE:  02/10/2005  DATE OF DISCHARGE:                                 OPERATIVE REPORT   PREOP DIAGNOSIS:  Tongue-type right calcaneus fracture.   POSTOP DIAGNOSIS:  Tongue-type right calcaneus fracture.   PROCEDURE:  Open reduction internal fixation right calcaneus fracture.   SURGEON:  Nadara Mustard, MD   ANESTHESIA:  General.   ESTIMATED BLOOD LOSS:  Minimal.   ANTIBIOTICS:  1 gram of Kefzol.   DRAINS:  None.   COMPLICATIONS:  None.   TOURNIQUET TIME:  None.   DISPOSITION:  To PACU in stable condition.   INDICATIONS FOR PROCEDURE:  The patient is a 55 year old woman who was  standing on her bed to change the speed of her ceiling fan when she fell  landing on her right calcaneus. The patient sustained a closed calcaneus  fracture. The patient was placed in a compressive dressing to decrease the  swelling and presents, at this time, for open reduction internal fixation.  Risks and benefits of surgery were discussed including infection,  neurovascular injury, nonhealing of the wound, need for additional surgery.  The patient states that he understands and wishes to proceed at this time.   DESCRIPTION OF PROCEDURE:  The patient was brought to OR room #5 and  underwent a general anesthetic. After an adequate level of anesthesia  obtained, the patient was placed in left lateral decubitus position with the  right side up and the right lower extremity was prepped using DuraPrep,  draped into a sterile field and Ioban was used to cover all exposed skin.  The patient's previous fracture blisters had burst and there was  epithelialization beneath the old fracture blisters.   An extensile lateral incision was made along  the vermilion border just  anterior to the Achilles tendon and curved at the distal aspect. This was  carried sharply down to the calcaneus and subperiosteal dissection was used  to elevate a thick flap. The flap was pulled during the retraction. A clamp  was placed around the tongue-type fracture.  The fracture site was cleansed  and irrigated and the fracture was reduced. Prior to open reduction, a  Steinmann pin was placed posteriorly and attempt at a closed reduction was  performed; and this was unable to be closed reduced. Ioban drapes were used  to cover all exposed skin.   After the fracture was reduced three cortical screws using lag screw  technique were placed from the posterior os calcis anteriorly to secure the  tongue-type fracture. These were 50 mm in length. Radiographs showed stable  alignment after reduction. The wound was irrigated. The subcu was closed  using 2-0 Vicryl. The skin was closed using 2-0 nylon with an Algower suture  and there were no sutures that crossed over the flap skin. The wound was  covered  with Mepitel.  Bactroban cream was applied over the wound and the  epithelialized fracture blisters. The leg was then wrapped in a compressive  Robert Stone's compressive dressing. The patient was extubated, taken to  PACU in stable condition. She will adhere to strict elevation,  nonweightbearing on the right.  Plan to followup in the office, 2 weeks  after discharge.      Nadara Mustard, MD  Electronically Signed     MVD/MEDQ  D:  02/10/2005  T:  02/10/2005  Job:  [id number]

## 2010-09-17 NOTE — Consult Note (Signed)
NAME:  [name], GEDDES NO.:  [id number]   MEDICAL RECORD NO.:  [id number]          PATIENT TYPE:  EMS   LOCATION:  ED                           FACILITY:  [facility]   PHYSICIAN:  Nadara Mustard, MD     DATE OF BIRTH:  07-Jan-1956   DATE OF CONSULTATION:  DATE OF DISCHARGE:                                   CONSULTATION   HISTORY OF PRESENT ILLNESS:  Patient is a 55 year old woman who states that  she was standing on her bed to change the direction of her ceiling fan, when  she fell, landing on her right heel.  Patient complained of immediate right  heel pain.  Patient denies any other injuries.   OBJECTIVE:  Patient does have some bruising on the right knee.  Both lower  extremities are neurovascularly intact.  She has a good dorsalis pedis  pulse.  She has an obvious deformity of the right calcaneus.   Radiograph shows a tongue type right calcaneus fracture.   ASSESSMENT:  Right tongue type calcaneal fracture.   PLAN:  After informed consent and sterile prepping, patient underwent a  hematoma block with 10 cc of 1% lidocaine plain.  She then underwent flexion  of the knee, plantar flexion of the foot, and reduction of the calcaneal  fragment.  She then was wrapped in a bulky soft dressing.  Patient was given  instructions for strict elevation with her foot above her heart.  Nonweightbearing.  She will be given a walker.  She is given a prescription  for Tylox for pain.  Patient states that Vicodin does not help her with her  pain and Tylox is the only pain medication which will help her.  She will  call the office tomorrow for followup in the office on Monday for  anticipated open reduction/internal fixation.   The patient denies a history of tobacco use.  She denies a history of  diabetes.  Denies a history of hypertension.      Nadara Mustard, MD  Electronically Signed     MVD/MEDQ  D:  02/03/2005  T:  02/03/2005  Job:  [id number]

## 2010-09-17 NOTE — H&P (Signed)
[name], [name]:  [id number]   MEDICAL RECORD NO.:  [id number]                   PATIENT TYPE:  AMB   LOCATION:  DAY                                  [facility]ACILITY:  [facility]   PHYSICIAN:  Almedia Balls. Fore, M.D.                DATE OF BIRTH:  [birth year]   DATE OF ADMISSION:  03/06/2002  DATE OF DISCHARGE:                                HISTORY & PHYSICAL   CHIEF COMPLAINT:  Abnormal bleeding, pain, fibroids.   HISTORY OF PRESENT ILLNESS:  The patient is a 55 year old whose last  menstrual period was in September 2003.  She has had over the past several  years increasingly severe problems with her menses in that they are  extremely painful and involve large clots and heavy bleeding.  She has  missed work several days because of these problems.  Examination in  September revealed the uterus to be enlarging and to be approximately [redacted]  weeks gestational size with tenderness at the uterus and ovaries.  The  patient had undergone hysteroscopy, D&C with benign pathology and  laparoscopy in November 2000, with finding of endometriosis.  It was felt  that this particular problem could be recurring.  She is admitted at this  time for hysterectomy, possible bilateral salpingo-oophorectomy, possible  appendectomy.  She has been fully counseled as to the nature of the  procedure and the risks involved to include risks of anesthesia, injury to  bowel, bladder, blood vessels, ureters, postoperative hemorrhage, infection,  recuperation, possibility of hormone replacement should her ovaries be  removed.  She fully understands all these considerations and wishes to  proceed on March 06, 2002.   PAST MEDICAL HISTORY:  1. Laparoscopic sterilization procedure in 1994.  2. History of mitral valve prolapse for which she takes prophylactic     antibiotics.  3. Depression problems for which she is taking Lexapro 20 mg daily.   ALLERGIES:  She is allergic to no  medications.   SOCIAL HISTORY:  She is a nonsmoker, nondrinker.   MEDICATIONS:  1. Toprol XL 50 mg daily.  2. Nexium 40 mg daily.  3. Lexapro 20 mg daily.  4. Klonopin 1 mg 3-4 times a day.  5. Wellbutrin 300 mg daily.  6. Trazodone 150 mg daily.  7. Aspirin 81 mg daily.   FAMILY HISTORY:  None, because the patient is adopted.   REVIEW OF SYSTEMS:  HEENT:  Headaches, felt to be tension in etiology.  CARDIORESPIRATORY:  Heart murmur.  Mitral valve prolapse.  Occasional  irregularity secondary to mitral valve prolapse.  GASTROINTESTINAL:  Constipation to alternate with diarrhea.  GENITOURINARY:  As noted above.  NEUROMUSCULAR:  Negative.   PHYSICAL EXAMINATION:  VITAL SIGNS:  Height 5 feet 7-1/4 inches, weight 252  pounds.  Blood pressure 132/74, respirations 18.  GENERAL:  Well-developed white female in no acute distress.  HEENT:  Within normal limits.  NECK:  Supple.  Without masses, adenopathy, or bruits.  HEART:  Regular rate and rhythm.  With presystolic click in the mitral valve  area.  LUNGS:  Clear to P&A.  BREASTS:  Examined sitting and lying, without mass.  Axilla negative.  ABDOMEN:  Soft.  Without mass.  Somewhat tender in both lower quadrants.  PELVIC:  External genitalia, Bartholin, urethral, and Skene's glands within  normal limits.  Cervix is slightly inflamed.  Uterus is posterior, enlarged,  and somewhat irregular.  Adnexal areas are tender, but no palpable masses  are appreciated.  EXTREMITIES:  Within normal limits.  CNS:  Grossly intact.  SKIN:  Without suspicious lesions.   IMPRESSION:  Abnormal uterine bleeding.  Pelvic pain.  Uterine enlargement.   DISPOSITION:  As noted above.  Of note is that a Pap smear was normal in  September 2003.                                               Almedia Balls. Randell Patient, M.D.    SRF/MEDQ  D:  03/04/2002  T:  03/04/2002  Job:  [id number]

## 2010-09-17 NOTE — Discharge Summary (Signed)
[name], [name]:  [id number]   MEDICAL RECORD NO.:  [id number]          PATIENT TYPE:  IPS   LOCATION:  0506                          FACILITY:  [facility]   PHYSICIAN:  Geoffery Lyons, M.D.      DATE OF BIRTH:  Sep 27, 1955   DATE OF ADMISSION:  07/17/2004  DATE OF DISCHARGE:  07/19/2004                                 DISCHARGE SUMMARY   CHIEF COMPLAINT AND PRESENT ILLNESS:  This was the first admission to [facility] for this 55 year old married white female voluntarily  admitted.  Lost her job a few days prior to this admission.  The day before  had been the fourth anniversary of her father's death.  She had not been  sleeping well this past week due to coming anniversary of her father's death  and then being let go affected her markedly.  She emailed a friend and  indicated that she was going to stay goodbye to her sons.  The friend  happened to work for a crisis line and called the police.  The police showed  up at her home.  She volunteered to come for clearance.  Endorsed that she  was not suicidal.  Her husband was concerned that she had not conveyed to  him exactly how upset about losing her job.   PAST PSYCHIATRIC HISTORY:  Four years prior to this admission, sudden death  of her father. She came to IOP.  Has seen Dr. [name] since then.   ALCOHOL/DRUG HISTORY:  Denies the use or abuse of any substances.   MEDICAL HISTORY:  Insomnia.   MEDICATIONS:  Sonata 20 mg at night, Klonopin 2 mg at night, Cymbalta 60 mg  at night, Nexium 40 mg daily, Toprol XL 50 mg daily for arrhythmia.   PHYSICAL EXAMINATION:  Performed and failed to show any acute findings.   LABORATORY DATA:  CBC with white blood cells 15.0, hematocrit 13.7.  Blood  chemistries with SGOT 22, SGPT 17, glucose 100.  TSH 4.070.  Drug screen  positive for benzodiazepines.   MENTAL STATUS EXAM:  Alert, cooperative female appropriately groomed and  dressed and  nourished.  Speech was normal in rate, tempo and production.  Mood was depressed and anxious.  Affect was congruent.  Thought processes  were logical, rational and coherent.  Requesting discharge.  She felt that  she just needs to go to her family therapist to communicate better with her  current husband.  Judgment and insight were appropriate.  Endorsed no  suicidal or homicidal ideation.  Endorsed that she was upset but says she  would never hurt herself.   ADMISSION DIAGNOSES:   AXIS I:  Major depression, recurrent.   AXIS II:  No diagnosis.   AXIS III:  History of pseudotumor.   AXIS IV:  Moderate.   AXIS V:  Global Assessment of Functioning upon admission 35; highest Global  Assessment of Functioning in the last year 75.   HOSPITAL COURSE:  She was admitted and started in individual and group  psychotherapy.  She was able to settle down.  She was able to open up in  individual and group process.  She was maintained on Sonata 10 mg at night,  trazodone 150 mg at night, Klonopin 1 mg four times a day, Cymbalta 60 mg  daily, Toprol XL 50 mg daily, Protonix 40 mg daily, Seroquel 50 mg every six  hours as needed.  She felt that she was doing well.  She was able to deal  with the grief, the loss of her job and the relationship.  Her mood was  better.  Her affect was bright, broad.  Denied any suicidal or homicidal  ideation.  Was willing and motivated to pursue further outpatient treatment  but felt she was not being helped by being in the unit.   DISCHARGE DIAGNOSES:   AXIS I:  Major depression, recurrent.   AXIS II:  No diagnosis.   AXIS III:  Pseudotumor, right eye.   AXIS IV:  Moderate.   AXIS V:  Global Assessment of Functioning upon discharge 50.   DISCHARGE MEDICATIONS:  1.  Cymbalta 60 mg daily.  2.  Toprol XL 50 mg daily.  3.  Protonix 40 mg daily.  4.  Klonopin 1 mg four times a day.  5.  Restoril 15 mg at night.  6.  Lunesta 3 mg at night as needed.  7.   Seroquel 25 mg, 2-4 tablets as needed for sleep.   FOLLOW UP:  Dr. [name] and Carlus Pavlov.      IL/MEDQ  D:  08/10/2004  T:  08/11/2004  Job:  [id number]

## 2010-09-17 NOTE — Discharge Summary (Signed)
   NAMEJOHANNA, MATTO NO.:  [id number]   MEDICAL RECORD NO.:  [id number]                   PATIENT TYPE:  OBV   LOCATION:  0483                                 [facility]ACILITY[facility]  [facility]   PHYSICIAN:  Almedia Balls. Fore, M.D.                DATE OF BIRTH:  02/26/1956   DATE OF ADMISSION:  03/06/2002  DATE OF DISCHARGE:  03/07/2002                                 DISCHARGE SUMMARY   HISTORY:  The patient is a 55 year old with abnormal uterine bleeding,  pelvic pain, slight uterine enlargement, history of endometriosis for a  hysterectomy and possible bilateral salpingo-oophorectomy on March 06, 2002.  The remainder of her history and physical are as previously dictated.   LABORATORY AND ACCESSORY DATA:  Include preoperative hemoglobin 11.6.   Chest x-ray which showed bronchial changes consistent with previous mild  bronchitis.  Electrocardiogram was normal.   HOSPITAL COURSE:  The patient was taken to the operating room on November  5th at which time abdominal supracervical hysterectomy was performed.  The  patient did well postoperatively.  Diet and ambulation were progressed over  the evening of November 5th and early morning of November 6th.  On the  morning of November 6th, she was afebrile and experiencing no problems  except for pain which was managed with oral analgesics.  It was felt that  she could be discharged at this time.   FINAL DIAGNOSES:  1. Abnormal uterine bleeding.  2. Pelvic pain.  3. Anemia secondary to #1.  4. History of endometriosis.   OPERATION:  Abdominal supracervical hysterectomy.   PATHOLOGY:  Report unavailable at the time of dictation.   DISPOSITION:  Discharged home to return to the office in two weeks for  followup.  She was fully ambulatory, on a regular diet, and in good  condition at the time of discharge.   MEDICATIONS:  She was given prescriptions for Dilaudid generic 2 mg dispense  #30 to be taken 1-2 tablets  q.4 h. p.r.n. pain, doxycycline 100 mg dispense  #12 to be taken one b.i.d., and Reglan 10 mg dispense #15 to be used one  t.i.d.   DISCHARGE INSTRUCTIONS:  She is to call for any problems.                                               Almedia Balls. Randell Patient, M.D.    SRF/MEDQ  D:  03/07/2002  T:  03/07/2002  Job:  [id number]

## 2011-01-31 LAB — BASIC METABOLIC PANEL
BUN: 8
CO2: 25
CO2: 27
Calcium: 8.1 — ABNORMAL LOW
Calcium: 8.4
Calcium: 8.6
Creatinine, Ser: 1.01
GFR calc Af Amer: >60
GFR calc Af Amer: >60
GFR calc Af Amer: >60
GFR calc Af Amer: >60
GFR calc non Af Amer: 58 — ABNORMAL LOW
GFR calc non Af Amer: >60
Glucose, Bld: 109 — ABNORMAL HIGH
Potassium: 4
Sodium: 140
Sodium: 141
Sodium: 143

## 2011-01-31 LAB — URINALYSIS, ROUTINE W REFLEX MICROSCOPIC
Glucose, UA: NEGATIVE
Hgb urine dipstick: NEGATIVE
Ketones, ur: NEGATIVE
Protein, ur: NEGATIVE
Specific Gravity, Urine: 1.008
Urobilinogen, UA: 0.2
pH: 7

## 2011-01-31 LAB — DIFFERENTIAL
Basophils Absolute: 0.1
Basophils Relative: 0
Basophils Relative: 1
Eosinophils Absolute: 0.1
Lymphocytes Relative: 33
Lymphocytes Relative: 34
Lymphocytes Relative: 37
Lymphs Abs: 3.9
Lymphs Abs: 4.8 — ABNORMAL HIGH
Monocytes Absolute: 0.6
Monocytes Absolute: 0.6
Monocytes Relative: 5
Monocytes Relative: 5
Neutro Abs: 10.7 — ABNORMAL HIGH
Neutro Abs: 7
Neutro Abs: 7.3
Neutro Abs: 8 — ABNORMAL HIGH
Neutrophils Relative %: 56

## 2011-01-31 LAB — APTT: aPTT: 97 — ABNORMAL HIGH

## 2011-01-31 LAB — CBC
HCT: 37.1
Hemoglobin: 10.8 — ABNORMAL LOW
Hemoglobin: 10.9 — ABNORMAL LOW
Hemoglobin: 11.4 — ABNORMAL LOW
MCV: 79.4
RBC: 4.15
RBC: 4.34
RBC: 4.67
RDW: 17.2 — ABNORMAL HIGH
RDW: 17.5 — ABNORMAL HIGH
WBC: 11.6 — ABNORMAL HIGH
WBC: 13.1 — ABNORMAL HIGH
WBC: 15.1 — ABNORMAL HIGH

## 2011-01-31 LAB — CK TOTAL AND CKMB (NOT AT ARMC)
CK, MB: 4.7 — ABNORMAL HIGH
Relative Index: 3.2 — ABNORMAL HIGH

## 2011-01-31 LAB — PROTIME-INR
INR: 1.5
Prothrombin Time: 13.5
Prothrombin Time: 24.1 — ABNORMAL HIGH

## 2011-01-31 LAB — CULTURE, BLOOD (ROUTINE X 2): Culture: NO GROWTH

## 2011-01-31 LAB — HEPARIN LEVEL (UNFRACTIONATED)
Heparin Unfractionated: 0.36
Heparin Unfractionated: 0.4

## 2011-01-31 LAB — CARDIAC PANEL(CRET KIN+CKTOT+MB+TROPI)
Relative Index: 1.3
Total CK: 284 — ABNORMAL HIGH
Troponin I: 0.3 — ABNORMAL HIGH
Troponin I: 0.39 — ABNORMAL HIGH

## 2011-01-31 LAB — URINE CULTURE: Colony Count: >=100000

## 2011-01-31 LAB — COMPREHENSIVE METABOLIC PANEL
ALT: 16
Albumin: 3.4 — ABNORMAL LOW
Alkaline Phosphatase: 153 — ABNORMAL HIGH
Chloride: 102
GFR calc non Af Amer: 56 — ABNORMAL LOW
Potassium: 3.9
Sodium: 138
Total Bilirubin: 0.6

## 2011-01-31 LAB — POCT CARDIAC MARKERS
CKMB, poc: 7.8
Troponin i, poc: 0.28 — ABNORMAL HIGH

## 2011-01-31 LAB — URINE MICROSCOPIC-ADD ON

## 2011-01-31 LAB — TROPONIN I: Troponin I: 0.67

## 2011-01-31 LAB — D-DIMER, QUANTITATIVE: D-Dimer, Quant: 2.62 — ABNORMAL HIGH

## 2011-05-12 DIAGNOSIS — H18719 Corneal ectasia, unspecified eye: Secondary | ICD-10-CM | POA: Insufficient documentation

## 2011-05-12 DIAGNOSIS — Q142 Congenital malformation of optic disc: Secondary | ICD-10-CM | POA: Insufficient documentation

## 2011-07-08 ENCOUNTER — Ambulatory Visit (INDEPENDENT_AMBULATORY_CARE_PROVIDER_SITE_OTHER): Payer: Self-pay | Admitting: General Surgery

## 2011-07-14 ENCOUNTER — Ambulatory Visit (INDEPENDENT_AMBULATORY_CARE_PROVIDER_SITE_OTHER): Payer: Medicare Other | Admitting: Surgery

## 2011-07-14 ENCOUNTER — Encounter (INDEPENDENT_AMBULATORY_CARE_PROVIDER_SITE_OTHER): Payer: Self-pay | Admitting: Surgery

## 2011-07-14 NOTE — Progress Notes (Signed)
Re:   [name] DOB:   [birth year] MRN:   [id number]  ASSESSMENT AND PLAN: 1.  Morbid obesity - Weight - 270, BMI - 42.4.  Per the 1991 NIH Consensus Statement, the patient is a candidate for bariatric surgery.  The patient attended our information session and reviewed the different types of bariatric surgery.    The patient is interested in the laparoscopic adjustable gastric band.  I discussed with the patient the indications and risks of lap band surgery.  The potential risks of surgery include, but are not limited to, bleeding, infection, DVT and PE, slippage and erosion of the band, open surgery, and death.  The patient understands the importance of compliance and long term follow-up with our group after surgery.  She was given literature regarding lap band surgery and encouraged to visit their web site, [email or web address], and register.  From here we'll obtain labs, x-rays, nutrition consult, and psych consult.  2.  Pseudotumor cerebri.  3.  History of PE. 2009.  No further problems. 4.  Diabetes.  Non insulin dependent. 5.  Hypertension - She claims to be on meds for protective effect. 6.  Hyperlipidemia. 7.  Husband died last 02/20/11 - acute MI. 8.  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 9.  GERD. 10.  On disability secondary to right heel injury - sees Dr. Lajoyce Corners. 11.  Psoriasis.  Chief Complaint  Patient presents with  . Bariatric Pre-op    Lap band initial   REFERRING PHYSICIAN: Mickie Hillier, MD, MD  HISTORY OF PRESENT ILLNESS: [name] is a 56 y.o. (DOB: Mar 09, 1956)  white female whose primary care physician is Mickie Hillier, MD, MD and comes to me today for bariatric surgery/lap band.  The patient has been overweight much of her adult life. She remembers in high school filling obese. Her mother does not help in that she bothers her repeatedly about her weight.  She has been to an information session. She has tried  Weight Watchers, Nutrisystem, and low calorie diets without success. The best weight she has lost was with Weight Watchers. She's tried some oral medicine for weight loss, but these have not had long-term success.  She is interested in lap band, but is not know anyone who has a LAP-BAND. We talked about going to support group for further information.  No history of stomach disease.  No history of liver disease.  No history of gall bladder disease.  No history of pancreas disease.  No history of colon disease.  She had a colonoscopy and upper endo by Dr. Jarold Motto in 2004.  She says she is due for another exam in 2014.    Past Medical History  Diagnosis Date  . Weight gain   . Night sweats   . Fatigue     due to loss of sleep  . Herpes   . Diabetes mellitus   . GERD (gastroesophageal reflux disease)   . Hx of blood clots   . Contact lens/glasses fitting   . Pulmonary embolism 2009      Past Surgical History  Procedure Date  . Eye surgery 03/2001    lasik  . Hysteroscopy 2002  . Laparoscopy 2002  . Abdominal hysterectomy 2003  . Pseudotumor cerebri       Current Outpatient Prescriptions  Medication Sig Dispense Refill  . ALPRAZolam (XANAX) 1 MG tablet 6 times daily.      Marland Kitchen buPROPion (WELLBUTRIN XL)  150 MG 24 hr tablet Daily.      . CYMBALTA 60 MG capsule Daily.      Marland Kitchen glimepiride (AMARYL) 2 MG tablet Daily.      . halobetasol (ULTRAVATE) 0.05 % ointment Daily.      Marland Kitchen losartan (COZAAR) 50 MG tablet Daily.      . mirtazapine (REMERON) 15 MG tablet At bedtime.      Marland Kitchen omeprazole (PRILOSEC) 20 MG capsule Daily.      . simvastatin (ZOCOR) 20 MG tablet Daily.      . traZODone (DESYREL) 100 MG tablet At bedtime.      . triamcinolone cream (KENALOG) 0.1 % Daily.         No Known Allergies  REVIEW OF SYSTEMS: Skin:  Psoriasis noticed in the last 6 months. Infection:  No history of hepatitis or HIV.  No history of MRSA. Neurologic:  No history of stroke.  No history of  seizure.  No history of headaches. Cardiac:  Hypertension.  Has MV prolapse.. No history of seeing a cardiologist. Pulmonary:  Does not smoke cigarettes.  No asthma or bronchitis.  No OSA/CPAP.  Endocrine:  Diabetes x 2 years.  No thyroid disease. Gastrointestinal:  See HPI. Urologic:  No history of kidney stones.  No history of bladder infections. Musculoskeletal:  Right ankle injury has her on disability. Hematologic:  History of DVT - 2009. Psycho-social:  The patient is oriented.   Talked about husband's death some.  SOCIAL and FAMILY HISTORY: Husband deceased.  Her husband died of poorly controlled DM - this may motivate the patient. She was adopted.  Has two children 34 and 31. On disability  PHYSICAL EXAM: BP 150/84  Pulse 72  Temp(Src) 97.9 F (36.6 C) (Temporal)  Resp 20  Ht 5\' 7"  (1.702 m)  Wt 270 lb 12.8 oz (122.834 kg)  BMI 42.41 kg/m2  General: Obese WF who is alert and generally healthy appearing.  HEENT: Normal. Pupils equal. Good dentition. Neck: Supple. No mass.  No thyroid mass.  Carotid pulse okay with no bruit. Lymph Nodes:  No supraclavicular or cervical nodes. Lungs: Clear to auscultation and symmetric breath sounds. Heart:  RRR. No murmur or rub.  Abdomen: Soft. No mass. No tenderness. No hernia. Normal bowel sounds.  No abdominal scars. She is more pear shaped than apple. Rectal: no mass, guaiac negative. Extremities:  Complains of right ankle weakness. Neurologic:  Grossly intact to motor and sensory function. Psychiatric: Has normal mood and affect. Behavior is normal.   DATA REVIEWED: Notes from Dr. Clarene Duke.  Ovidio Kin, MD,  [facility] [address] [place]  [address]   [place], [place]    [address] Phone:  [phone] FAX:  [phone]

## 2011-07-19 ENCOUNTER — Ambulatory Visit (HOSPITAL_COMMUNITY): Admission: RE | Admit: 2011-07-19 | Payer: Medicare Other | Source: Ambulatory Visit | Admitting: Surgery

## 2011-07-19 ENCOUNTER — Encounter (HOSPITAL_COMMUNITY): Admission: RE | Payer: Self-pay | Source: Ambulatory Visit

## 2011-07-19 SURGERY — BREATH TEST, FOR HELICOBACTER PYLORI
Anesthesia: Choice

## 2011-08-03 ENCOUNTER — Inpatient Hospital Stay (HOSPITAL_COMMUNITY): Admission: RE | Admit: 2011-08-03 | Payer: Self-pay | Source: Ambulatory Visit

## 2011-08-03 ENCOUNTER — Ambulatory Visit (HOSPITAL_COMMUNITY): Payer: Medicare Other | Attending: Surgery

## 2011-08-04 ENCOUNTER — Telehealth (INDEPENDENT_AMBULATORY_CARE_PROVIDER_SITE_OTHER): Payer: Self-pay | Admitting: Surgery

## 2011-08-04 NOTE — Telephone Encounter (Signed)
I received the following email from the patient. Advised her that we will keep her information and she may contact our office if she wishes to proceed in the future. [name]    From: Loie Simon [[email or web address]]  Wednesday, August 03, 2011 8:45 PM To: Leandrew Koyanagi Subject: Lap Band Surgery  Hi [name],  If you didn't think it before you will think it now. I stayed up until 4:30AM this morning goggling any & all stories I could find about Lap-Band.  I just can't do it.  I am SO sorry!!!!  The one thing I have dreamed for all my life, I am to scared to take the step!  I will take care of canceling all the appointments.  So sorry again for your inconvenience.  I am sure that in several more years they will have come further technically with the operation and I possible will consider it then.  The thought of only being able to hold 4-6 oz of food in my stomach at ALL times is scary in itself.  Much less the not drinking 30 minutes before eating, during eating & 30 minutes after.  I have started having some problems swallowing anyway & if I couldn't have any liquid I would choke to death.  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  I wish you the best and again,  Thank You,  Lazette Labrosse  [email or web address]

## 2011-08-11 ENCOUNTER — Ambulatory Visit: Payer: Medicare Other | Admitting: *Deleted

## 2011-08-12 ENCOUNTER — Encounter (HOSPITAL_COMMUNITY): Admission: RE | Payer: Self-pay | Source: Ambulatory Visit

## 2011-08-12 ENCOUNTER — Ambulatory Visit (HOSPITAL_COMMUNITY): Admission: RE | Admit: 2011-08-12 | Payer: Medicare Other | Source: Ambulatory Visit | Admitting: Surgery

## 2011-08-12 SURGERY — BREATH TEST, FOR HELICOBACTER PYLORI
Anesthesia: Choice

## 2011-09-05 ENCOUNTER — Ambulatory Visit: Payer: Medicare Other | Admitting: Gynecology

## 2011-10-07 ENCOUNTER — Emergency Department (HOSPITAL_COMMUNITY)
Admission: EM | Admit: 2011-10-07 | Discharge: 2011-10-08 | Disposition: A | Discharge status: Home / Self Care | Payer: Medicare Other | Attending: Emergency Medicine | Admitting: Emergency Medicine

## 2011-10-07 ENCOUNTER — Emergency Department (HOSPITAL_COMMUNITY): Payer: Medicare Other

## 2011-10-07 ENCOUNTER — Encounter (HOSPITAL_COMMUNITY): Payer: Self-pay | Admitting: Emergency Medicine

## 2011-10-07 DIAGNOSIS — E1169 Type 2 diabetes mellitus with other specified complication: Secondary | ICD-10-CM | POA: Insufficient documentation

## 2011-10-07 DIAGNOSIS — R110 Nausea: Secondary | ICD-10-CM

## 2011-10-07 DIAGNOSIS — R112 Nausea with vomiting, unspecified: Secondary | ICD-10-CM | POA: Insufficient documentation

## 2011-10-07 DIAGNOSIS — Z794 Long term (current) use of insulin: Secondary | ICD-10-CM | POA: Insufficient documentation

## 2011-10-07 DIAGNOSIS — R739 Hyperglycemia, unspecified: Secondary | ICD-10-CM

## 2011-10-07 LAB — POCT I-STAT TROPONIN I: Troponin i, poc: 0.02 ng/mL (ref 0.00–0.08)

## 2011-10-07 LAB — POCT I-STAT, CHEM 8
Creatinine, Ser: 0.7 mg/dL (ref 0.50–1.10)
HCT: 39 % (ref 36.0–46.0)
Hemoglobin: 13.3 g/dL (ref 12.0–15.0)
Potassium: 3.9 mEq/L (ref 3.5–5.1)
Sodium: 145 mEq/L (ref 135–145)

## 2011-10-07 LAB — URINE MICROSCOPIC-ADD ON

## 2011-10-07 LAB — URINALYSIS, ROUTINE W REFLEX MICROSCOPIC
Glucose, UA: NEGATIVE mg/dL
Hgb urine dipstick: NEGATIVE
Protein, ur: 30 mg/dL — AB
pH: 6.5 (ref 5.0–8.0)

## 2011-10-07 LAB — CARBAMAZEPINE LEVEL, TOTAL: Carbamazepine Lvl: 9.9 ug/mL (ref 4.0–12.0)

## 2011-10-07 MED ORDER — SODIUM CHLORIDE 0.9 % IV SOLN
1000.0000 mL | Freq: Once | INTRAVENOUS | Status: AC
  Administered 2011-10-07: 1000 mL via INTRAVENOUS

## 2011-10-07 MED ORDER — ONDANSETRON HCL 4 MG/2ML IJ SOLN
INTRAMUSCULAR | Status: AC
  Filled 2011-10-07: qty 2

## 2011-10-07 MED ORDER — ONDANSETRON HCL 4 MG/2ML IJ SOLN
INTRAMUSCULAR | Status: AC
  Administered 2011-10-07: 4 mg
  Filled 2011-10-07: qty 4

## 2011-10-07 MED ORDER — ALPRAZOLAM 0.5 MG PO TABS
1.0000 mg | ORAL_TABLET | Freq: Once | ORAL | Status: AC
  Administered 2011-10-07: 1 mg via ORAL
  Filled 2011-10-07: qty 2

## 2011-10-07 NOTE — ED Provider Notes (Addendum)
Complains of vomiting multiple times today. Denies chest pain denies abdominal pain feels much improved after treatment with Zofran. Reports last bowel movement 1.5 weeks ago not passing gas per rectum On exam alert nontoxic Glasgow Coma Score 15 lungs clear auscultation abdomen obese normal active bowel sounds nontender  Date: 10/07/2011  Rate: 95  Rhythm: normal sinus rhythm  QRS Axis: normal  Intervals: normal  ST/T Wave abnormalities: normal  Conduction Disutrbances: none  Narrative Interpretation: unremarkable Unchanged from 02/23/2008  Results for orders placed during the hospital encounter of 10/07/11  URINALYSIS, ROUTINE W REFLEX MICROSCOPIC      Component Value Range   Color, Urine YELLOW  YELLOW    APPearance CLOUDY (*) CLEAR    Specific Gravity, Urine 1.018  1.005 - 1.030    pH 6.5  5.0 - 8.0    Glucose, UA NEGATIVE  NEGATIVE (mg/dL)   Hgb urine dipstick NEGATIVE  NEGATIVE    Bilirubin Urine NEGATIVE  NEGATIVE    Ketones, ur NEGATIVE  NEGATIVE (mg/dL)   Protein, ur 30 (*) NEGATIVE (mg/dL)   Urobilinogen, UA 0.2  0.0 - 1.0 (mg/dL)   Nitrite NEGATIVE  NEGATIVE    Leukocytes, UA NEGATIVE  NEGATIVE   URINE MICROSCOPIC-ADD ON      Component Value Range   Squamous Epithelial / LPF FEW (*) RARE    Bacteria, UA FEW (*) RARE    Urine-Other MUCOUS PRESENT    POCT I-STAT, CHEM 8      Component Value Range   Sodium 145  135 - 145 (mEq/L)   Potassium 3.9  3.5 - 5.1 (mEq/L)   Chloride 105  96 - 112 (mEq/L)   BUN 10  6 - 23 (mg/dL)   Creatinine, Ser 2.95  0.50 - 1.10 (mg/dL)   Glucose, Bld 621 (*) 70 - 99 (mg/dL)   Calcium, Ion 3.08 (*) 1.12 - 1.32 (mmol/L)   TCO2 26  0 - 100 (mmol/L)   Hemoglobin 13.3  12.0 - 15.0 (g/dL)   HCT 65.7  84.6 - 96.2 (%)  CARBAMAZEPINE LEVEL, TOTAL      Component Value Range   Carbamazepine Lvl 9.9  4.0 - 12.0 (ug/mL)  POCT I-STAT TROPONIN I      Component Value Range   Troponin i, poc 0.02  0.00 - 0.08 (ng/mL)   Comment 3           GLUCOSE,  CAPILLARY      Component Value Range   Glucose-Capillary 86  70 - 99 (mg/dL)   Comment 1 Notify RN     Dg Abd Acute W/chest  10/07/2011  *RADIOLOGY REPORT*  Clinical Data: Nausea, emesis  ACUTE ABDOMEN SERIES (ABDOMEN 2 VIEW & CHEST 1 VIEW)  Comparison: None.  Findings: Mild linear opacities within the lungs may reflect scarring or atelectasis.  Heart size upper normal to mildly enlarged.  Otherwise, detailed evaluation is degraded by patient body habitus.  Decubitus radiographs demonstrate no definitive evidence for free intraperitoneal air. The bowel gas pattern is non-obstructive. Organ outlines are normal where seen. No acute or aggressive osseous abnormality identified.  IMPRESSION: Nonobstructive bowel gas pattern.  Original Report Authenticated By: Waneta Martins, M.D.    Doug Sou, MD 10/07/11 2055   Doug Sou, MD 10/08/11 [phone]

## 2011-10-07 NOTE — ED Provider Notes (Signed)
History     CSN: [id number]  Arrival date & time 10/07/11  1609   First MD Initiated Contact with Patient 10/07/11 1754      Chief Complaint  Patient presents with  . Nausea  . Emesis  . Medication Reaction    possible    (Consider location/radiation/quality/duration/timing/severity/associated sxs/prior treatment) Patient is a 56 y.o. female presenting with vomiting. The history is provided by the patient.  Emesis  The current episode started 6 to 12 hours ago. The problem occurs 5 to 10 times per day. The problem has been resolved. The emesis has an appearance of stomach contents. There has been no fever. Pertinent negatives include no abdominal pain, no diarrhea and no headaches.   56 y/o morbidly obese NIDDM female INAD c/o acute onset of vomittting (non-bloody, non-bilious emesis) x6 hours. Pt psych RX was changed x8 days ago to tegretol for insomnia. Pt thinks it may be adverse drug reaction. Denies abdominal pain, sick contacts or fever. Pt received 8mg  Zofran IV en route to ED and nausea and vomiting are now resolved.  Past Medical History  Diagnosis Date  . Weight gain   . Night sweats   . Fatigue     due to loss of sleep  . Herpes   . Diabetes mellitus   . GERD (gastroesophageal reflux disease)   . Hx of blood clots   . Contact lens/glasses fitting   . Pulmonary embolism 2009    Past Surgical History  Procedure Date  . Eye surgery 03/2001    lasik  . Hysteroscopy 2002  . Laparoscopy 2002  . Abdominal hysterectomy 2003  . Pseudotumor cerebri     Family History  Problem Relation Age of Onset  . Adopted: Yes    History  Substance Use Topics  . Smoking status: Never Smoker   . Smokeless tobacco: Never Used  . Alcohol Use: No    OB History    Grav Para Term Preterm Abortions TAB SAB Ect Mult Living                  Review of Systems  Constitutional: Negative.   Respiratory: Negative for shortness of breath and stridor.   Cardiovascular: Negative  for chest pain and leg swelling.  Gastrointestinal: Positive for nausea and vomiting. Negative for abdominal pain, diarrhea, constipation, blood in stool and abdominal distention.  Genitourinary: Negative for dysuria and difficulty urinating.  Neurological: Negative for dizziness, light-headedness and headaches.    Allergies  Metformin and related  Home Medications   Current Outpatient Rx  Name Route Sig Dispense Refill  . ALPRAZOLAM 1 MG PO TABS Oral Take 1 mg by mouth 4 (four) times daily as needed. For anxiety    . BUPROPION HCL ER (XL) 150 MG PO TB24 Oral Take 150 mg by mouth Daily.     Marland Kitchen CARBAMAZEPINE ER 200 MG PO TB12 Oral Take 600 mg by mouth at bedtime.    . CYMBALTA 60 MG PO CPEP Oral Take 60 mg by mouth Daily.     Marland Kitchen GLIMEPIRIDE 2 MG PO TABS Oral Take 2 mg by mouth Daily.     Marland Kitchen LOSARTAN POTASSIUM 50 MG PO TABS Oral Take 50 mg by mouth Daily.     Marland Kitchen OMEPRAZOLE 20 MG PO CPDR Oral Take 20 mg by mouth Daily.     Marland Kitchen SIMVASTATIN 20 MG PO TABS Oral Take 20 mg by mouth at bedtime.       BP 152/72  Temp(Src) 97.4  F (36.3 C) (Oral)  Resp 20  Wt 250 lb (113.399 kg)  SpO2 100%  Physical Exam  Constitutional: She is oriented to person, place, and time. She appears well-developed and well-nourished. No distress.       Obese  HENT:  Head: Normocephalic and atraumatic.  Eyes: Pupils are equal, round, and reactive to light.  Cardiovascular: Normal rate and regular rhythm.   Pulmonary/Chest: Effort normal and breath sounds normal. No respiratory distress. She has no wheezes. She has no rales. She exhibits no tenderness.  Abdominal: Soft. Bowel sounds are normal. She exhibits no mass. There is no tenderness. There is no rebound and no guarding.       Rectal exam shows stool in vault, color wnl: no gross blood or dark coloring  Neurological: She is alert and oriented to person, place, and time.  Skin: Skin is warm and dry. She is not diaphoretic.    ED Course  Procedures (including  critical care time)  Labs Reviewed - No data to display No results found.   No diagnosis found.  Pt given PO challenge of water, tolerates without difficulty.   Serial abdominal exams show soft non-tender abdomen.   MDM  dDx Atypical ACS, Adverse drug reaction, viral gastrenteritis        Wynetta Emery 10/07/11 2108  Doug Sou, MD 10/08/11 [phone]

## 2011-10-07 NOTE — ED Notes (Signed)
Pt. Had sudden onset of vomiting around 2pm today.  Vomiting stopped mild nausea continues. EMS gave a total of 8mg  Zofran.  Pt. Had been having a little dizziness prior to vomiting episode.  Recent changes to sleep medications last week.

## 2011-10-07 NOTE — ED Notes (Addendum)
From home. Woke up today and was fine.  Last Thurs was taken off trazadone and remeron abruptly and given tegretol 100mg  (and was to increase it as directed). Started feeling nausea x 3hrs ago, after the increase of the tegretol 200mg  (had taken the 2nd dose for today) and starting to vomit "for about an hour straight"   Pt was given total of 8mg  zofran IV.  Has #22g IV to [facility] placed by EMS.  VS:174/88,88,24,96% RA.  12lead EKG of NSR.  [id number].

## 2011-10-07 NOTE — ED Notes (Signed)
ZOX:[id number]<VW> Expected date:<BR> Expected time:<BR> Means of arrival:<BR> Comments:<BR> hold

## 2011-10-07 NOTE — ED Notes (Signed)
Pt. Reporting feeling like she is very anxious.  She takes 3 Xanax per day 1mg . Each.

## 2011-10-08 MED ORDER — ONDANSETRON HCL 4 MG PO TABS: 4.0000 mg | ORAL_TABLET | Freq: Three times a day (TID) | ORAL | Status: AC | PRN

## 2011-12-22 ENCOUNTER — Other Ambulatory Visit (INDEPENDENT_AMBULATORY_CARE_PROVIDER_SITE_OTHER): Payer: Self-pay

## 2011-12-27 ENCOUNTER — Ambulatory Visit (HOSPITAL_COMMUNITY)
Admission: RE | Admit: 2011-12-27 | Discharge: 2011-12-27 | Disposition: A | Discharge status: Home / Self Care | Payer: Medicare Other | Source: Ambulatory Visit | Attending: Surgery | Admitting: Surgery

## 2011-12-27 DIAGNOSIS — I059 Rheumatic mitral valve disease, unspecified: Secondary | ICD-10-CM | POA: Insufficient documentation

## 2011-12-27 DIAGNOSIS — E119 Type 2 diabetes mellitus without complications: Secondary | ICD-10-CM | POA: Insufficient documentation

## 2011-12-27 DIAGNOSIS — K224 Dyskinesia of esophagus: Secondary | ICD-10-CM | POA: Insufficient documentation

## 2011-12-27 DIAGNOSIS — E785 Hyperlipidemia, unspecified: Secondary | ICD-10-CM | POA: Insufficient documentation

## 2011-12-27 DIAGNOSIS — Z6841 Body Mass Index (BMI) 40.0 and over, adult: Secondary | ICD-10-CM | POA: Insufficient documentation

## 2011-12-27 DIAGNOSIS — G932 Benign intracranial hypertension: Secondary | ICD-10-CM | POA: Insufficient documentation

## 2011-12-27 DIAGNOSIS — I10 Essential (primary) hypertension: Secondary | ICD-10-CM | POA: Insufficient documentation

## 2011-12-29 ENCOUNTER — Encounter: Payer: Self-pay | Admitting: *Deleted

## 2011-12-29 ENCOUNTER — Other Ambulatory Visit (INDEPENDENT_AMBULATORY_CARE_PROVIDER_SITE_OTHER): Payer: Self-pay | Admitting: Surgery

## 2011-12-29 ENCOUNTER — Encounter: Payer: Medicare Other | Attending: Surgery | Admitting: *Deleted

## 2011-12-29 ENCOUNTER — Ambulatory Visit: Payer: Medicare Other | Admitting: *Deleted

## 2011-12-29 DIAGNOSIS — Z01818 Encounter for other preprocedural examination: Secondary | ICD-10-CM | POA: Insufficient documentation

## 2011-12-29 DIAGNOSIS — Z713 Dietary counseling and surveillance: Secondary | ICD-10-CM | POA: Insufficient documentation

## 2011-12-29 NOTE — Progress Notes (Signed)
  Pre-Op Assessment Visit:  Pre-Operative LAGB Surgery  Medical Nutrition Therapy:  Appt start time: 0830   End time:  0930.  Patient was seen on 12/29/2011 for Pre-Operative LAGB Nutrition Assessment. Assessment and letter of approval faxed to [facility]ariatric [facility] Program coordinator on 12/29/2011.  Approval letter sent to [facility] and will be available in the chart under the media tab.  Handouts given during visit include:  Pre-Op Goals   Bariatric Surgery Protein Shakes  Patient to call for Pre-Op and Post-Op Nutrition Education at the [facility] when surgery is scheduled.

## 2011-12-29 NOTE — Patient Instructions (Signed)
   Follow Pre-Op Nutrition Goals to prepare for Lapband Surgery.   Call the [facility] at [phone] once you have been given your surgery date to enrolled in the Pre-Op Nutrition Class. You will need to attend this nutrition class 3-4 weeks prior to your surgery. 

## 2012-01-09 ENCOUNTER — Ambulatory Visit (HOSPITAL_COMMUNITY)
Admission: RE | Admit: 2012-01-09 | Discharge: 2012-01-09 | Disposition: A | Discharge status: Home / Self Care | Payer: Medicare Other | Source: Ambulatory Visit | Attending: Surgery | Admitting: Surgery

## 2012-01-09 ENCOUNTER — Encounter (HOSPITAL_COMMUNITY)
Admission: RE | Disposition: A | Discharge status: Home / Self Care | Payer: Self-pay | Source: Ambulatory Visit | Attending: Surgery

## 2012-01-09 DIAGNOSIS — Z01818 Encounter for other preprocedural examination: Secondary | ICD-10-CM | POA: Insufficient documentation

## 2012-01-09 HISTORY — PX: BREATH TEK H PYLORI: SHX5422

## 2012-01-09 SURGERY — BREATH TEST, FOR HELICOBACTER PYLORI

## 2012-01-10 ENCOUNTER — Encounter (HOSPITAL_COMMUNITY): Payer: Self-pay | Admitting: Surgery

## 2012-01-19 ENCOUNTER — Encounter (INDEPENDENT_AMBULATORY_CARE_PROVIDER_SITE_OTHER): Payer: Self-pay | Admitting: General Surgery

## 2012-01-19 ENCOUNTER — Ambulatory Visit (INDEPENDENT_AMBULATORY_CARE_PROVIDER_SITE_OTHER): Payer: Medicare Other | Admitting: General Surgery

## 2012-01-19 VITALS — BP 146/84 | HR 80 | Temp 97.6°F | Resp 18 | Ht 67.0 in | Wt 265.6 lb

## 2012-01-19 DIAGNOSIS — Z6841 Body Mass Index (BMI) 40.0 and over, adult: Secondary | ICD-10-CM

## 2012-01-19 NOTE — Progress Notes (Signed)
Patient ID: [name], female   DOB: 1955/08/22, 56 y.o.   MRN: [id number]  No chief complaint on file.   HPI [name] is a 56 y.o. female.  This patient is seen for evaluation for weight loss surgery. She has a BMI of 42 with comorbidities of diabetes, hypertension, reflux, hyperlipidemia, depression, and anxiety. She struggled with her weight "all my life" and has done several diets with yo-yo results. She has tried Weight Watchers and [place] weight loss and other home diet as well as a six-month physician supervised diet earlier this year. She lost 40 pounds during her supervised diets but her most effective was Weight Watchers. She is a diabetic and has been diabetic for 2 years with her most recent hemoglobin A1c being 6.0. She was originally investigating the lap band but has decided that she is more interested in a sleeve gastrectomy. HPI  Past Medical History  Diagnosis Date  . Weight gain   . Night sweats   . Fatigue     due to loss of sleep  . Herpes   . Diabetes mellitus   . GERD (gastroesophageal reflux disease)   . Hx of blood clots   . Contact lens/glasses fitting   . Morbid obesity   . Pulmonary embolism 2009  . Fracture of right heel 2006  . 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 Eye surgery 03/2001    lasik  . Hysteroscopy 2002  . Laparoscopy 2002  . Abdominal hysterectomy 2003  . Pseudotumor cerebri   . Breath tek h pylori 01/09/2012    Procedure: BREATH TEK H PYLORI;  Surgeon: Kandis Cocking, MD;  Location: [facility];  Service: General;  Laterality: N/A;    Family History  Problem Relation Age of Onset  . Adopted: Yes    Social History History  Substance Use Topics  . Smoking status: Never Smoker   . Smokeless tobacco: Never Used  . Alcohol Use: No    Allergies  Allergen Reactions  . Metformin And Related Diarrhea  . Tegretol (Carbamazepine)     Current Outpatient  Prescriptions  Medication Sig Dispense Refill  . ALPRAZolam (XANAX) 1 MG tablet Take 1 mg by mouth 4 (four) times daily as needed. For anxiety      . aspirin 81 MG tablet Take 81 mg by mouth daily.      Marland Kitchen buPROPion (WELLBUTRIN XL) 150 MG 24 hr tablet Take 150 mg by mouth Daily.       . CYMBALTA 60 MG capsule Take 60 mg by mouth Daily.       Marland Kitchen glimepiride (AMARYL) 2 MG tablet Take 4 mg by mouth Daily.       Marland Kitchen losartan (COZAAR) 50 MG tablet Take 50 mg by mouth Daily.       . mirtazapine (REMERON) 15 MG tablet Take 15 mg by mouth at bedtime.      Marland Kitchen omeprazole (PRILOSEC) 20 MG capsule Take 20 mg by mouth Daily.       . simvastatin (ZOCOR) 20 MG tablet Take 20 mg by mouth at bedtime.         Review of Systems Review of Systems All other review of systems negative or noncontributory except as stated in the HPI  Blood pressure 146/84, pulse 80, temperature 97.6 F (36.4 C), resp. rate 18, height 5\' 7"  (1.702 m), weight 265 lb 9.6 oz (120.475  kg).  Physical Exam Physical Exam Physical Exam  Nursing note and vitals reviewed. Constitutional: She is oriented to person, place, and time. She appears well-developed and well-nourished. No distress.  HENT:  Head: Normocephalic and atraumatic.  Mouth/Throat: No oropharyngeal exudate.  Eyes: Conjunctivae and EOM are normal. Pupils are equal, round, and reactive to light. Right eye exhibits no discharge. Left eye exhibits no discharge. No scleral icterus.  Neck: Normal range of motion. Neck supple. No tracheal deviation present.  Cardiovascular: Normal rate, regular rhythm, normal heart sounds and intact distal pulses.   Pulmonary/Chest: Effort normal and breath sounds normal. No stridor. No respiratory distress. She has no wheezes.  Abdominal: Soft. Bowel sounds are normal. She exhibits no distension and no mass. There is no tenderness. There is no rebound and no guarding.  Musculoskeletal: Normal range of motion. She exhibits no edema and no  tenderness.  Neurological: She is alert and oriented to person, place, and time.  Skin: Skin is warm and dry. No rash noted. She is not diaphoretic. No erythema. No pallor.  Psychiatric: She has a normal mood and affect. Her behavior is normal. Judgment and thought content normal.   Data Reviewed Labs, radiology  Assessment    Morbid obesity with a BMI of 42 and comorbidities of diabetes, hypertension, hyperlipidemia, reflux, depression and anxiety We had a long discussion regarding the risks and benefits and options for weight loss and weight loss surgery. We discussed the surgical options of the LAP-BAND, sleeve gastrectomy, and Roux-en-Y gastric bypass.The risks of infection, bleeding, pain, scarring, weight regain, too little or too much weight loss, vitamin deficiencies and need for lifelong vitamin supplementation, hair loss, need for protein supplementation, leaks, stricture, reflux, food intolerance, need for reoperation and conversion to roux Y gastric bypass, need for open surgery, injury to spleen or surrounding structures, DVT's, PE, and death again discussed with the patient and the patient expressed understanding and desires to proceed with laparoscopic vertical sleeve gastrectomy, possible open, intraoperative endoscopy. I have not seen her psychology evaluation and after I have seen this I think that she will be about ready for scheduling. She seems to understand the procedure and the risks and is interested in proceeding with a sleeve gastrectomy     Plan    We will set her up for vertical sleeve gastrectomy after she completes her preoperative workup.       Lodema Pilot DAVID 01/19/2012, 11:53 AM

## 2012-02-04 ENCOUNTER — Ambulatory Visit: Payer: Medicare Other | Admitting: *Deleted

## 2012-02-16 ENCOUNTER — Ambulatory Visit (HOSPITAL_COMMUNITY)
Admission: RE | Admit: 2012-02-16 | Discharge: 2012-02-16 | Disposition: A | Discharge status: Home / Self Care | Payer: Medicare Other | Attending: Psychiatry | Admitting: Psychiatry

## 2012-02-16 ENCOUNTER — Telehealth (HOSPITAL_COMMUNITY): Payer: Self-pay

## 2012-02-16 ENCOUNTER — Encounter (HOSPITAL_COMMUNITY): Payer: Self-pay

## 2012-02-16 ENCOUNTER — Emergency Department (HOSPITAL_COMMUNITY)
Admission: EM | Admit: 2012-02-16 | Discharge: 2012-02-17 | Disposition: A | Discharge status: Other Acute Inpatient Hospital | Payer: Medicare Other | Attending: Dermatology | Admitting: Dermatology

## 2012-02-16 DIAGNOSIS — R45851 Suicidal ideations: Secondary | ICD-10-CM | POA: Insufficient documentation

## 2012-02-16 DIAGNOSIS — R5383 Other fatigue: Secondary | ICD-10-CM | POA: Insufficient documentation

## 2012-02-16 DIAGNOSIS — K219 Gastro-esophageal reflux disease without esophagitis: Secondary | ICD-10-CM | POA: Insufficient documentation

## 2012-02-16 DIAGNOSIS — Z7982 Long term (current) use of aspirin: Secondary | ICD-10-CM | POA: Insufficient documentation

## 2012-02-16 DIAGNOSIS — A6000 Herpesviral infection of urogenital system, unspecified: Secondary | ICD-10-CM | POA: Insufficient documentation

## 2012-02-16 DIAGNOSIS — F329 Major depressive disorder, single episode, unspecified: Secondary | ICD-10-CM | POA: Insufficient documentation

## 2012-02-16 DIAGNOSIS — Z79899 Other long term (current) drug therapy: Secondary | ICD-10-CM | POA: Insufficient documentation

## 2012-02-16 DIAGNOSIS — E119 Type 2 diabetes mellitus without complications: Secondary | ICD-10-CM | POA: Insufficient documentation

## 2012-02-16 DIAGNOSIS — F3289 Other specified depressive episodes: Secondary | ICD-10-CM | POA: Insufficient documentation

## 2012-02-16 DIAGNOSIS — R5381 Other malaise: Secondary | ICD-10-CM | POA: Insufficient documentation

## 2012-02-16 DIAGNOSIS — D72829 Elevated white blood cell count, unspecified: Secondary | ICD-10-CM | POA: Insufficient documentation

## 2012-02-16 HISTORY — DX: Major depressive disorder, single episode, unspecified: F32.9

## 2012-02-16 HISTORY — DX: Depression, unspecified: F32.A

## 2012-02-16 HISTORY — DX: Suicidal ideations: R45.851

## 2012-02-16 LAB — CBC
MCH: 27.8 pg (ref 26.0–34.0)
MCV: 85.1 fL (ref 78.0–100.0)
Platelets: 298 10*3/uL (ref 150–400)
RBC: 4.42 MIL/uL (ref 3.87–5.11)

## 2012-02-16 LAB — COMPREHENSIVE METABOLIC PANEL
AST: 20 U/L (ref 0–37)
CO2: 27 mEq/L (ref 19–32)
Calcium: 9 mg/dL (ref 8.4–10.5)
Creatinine, Ser: 0.93 mg/dL (ref 0.50–1.10)
GFR calc Af Amer: 77 mL/min — ABNORMAL LOW (ref >90)
GFR calc non Af Amer: 66 mL/min — ABNORMAL LOW (ref >90)

## 2012-02-16 LAB — ACETAMINOPHEN LEVEL: Acetaminophen (Tylenol), Serum: <15 ug/mL (ref 10–30)

## 2012-02-16 LAB — RAPID URINE DRUG SCREEN, HOSP PERFORMED: Benzodiazepines: POSITIVE — AB

## 2012-02-16 LAB — SALICYLATE LEVEL: Salicylate Lvl: <2 mg/dL — ABNORMAL LOW (ref 2.8–20.0)

## 2012-02-16 MED ORDER — GLIMEPIRIDE 4 MG PO TABS
4.0000 mg | ORAL_TABLET | Freq: Every day | ORAL | Status: DC
  Administered 2012-02-16 – 2012-02-17 (×2): 4 mg via ORAL
  Filled 2012-02-16 (×4): qty 1

## 2012-02-16 MED ORDER — MIRTAZAPINE 30 MG PO TABS
15.0000 mg | ORAL_TABLET | Freq: Every day | ORAL | Status: DC
  Administered 2012-02-16: 15 mg via ORAL
  Filled 2012-02-16 (×2): qty 1

## 2012-02-16 MED ORDER — BUPROPION HCL ER (XL) 150 MG PO TB24
150.0000 mg | ORAL_TABLET | Freq: Every day | ORAL | Status: DC
  Administered 2012-02-16 – 2012-02-17 (×2): 150 mg via ORAL
  Filled 2012-02-16 (×2): qty 1

## 2012-02-16 MED ORDER — ASPIRIN EC 81 MG PO TBEC
81.0000 mg | DELAYED_RELEASE_TABLET | Freq: Every day | ORAL | Status: DC
  Administered 2012-02-16 – 2012-02-17 (×2): 81 mg via ORAL
  Filled 2012-02-16 (×2): qty 1

## 2012-02-16 MED ORDER — SIMVASTATIN 20 MG PO TABS
20.0000 mg | ORAL_TABLET | Freq: Every day | ORAL | Status: DC
  Administered 2012-02-16: 20 mg via ORAL
  Filled 2012-02-16 (×2): qty 1

## 2012-02-16 MED ORDER — ALPRAZOLAM 1 MG PO TABS
1.0000 mg | ORAL_TABLET | Freq: Four times a day (QID) | ORAL | Status: DC | PRN
  Administered 2012-02-16 – 2012-02-17 (×4): 1 mg via ORAL
  Filled 2012-02-16 (×4): qty 1

## 2012-02-16 MED ORDER — LOSARTAN POTASSIUM 50 MG PO TABS
50.0000 mg | ORAL_TABLET | Freq: Every day | ORAL | Status: DC
  Administered 2012-02-16 – 2012-02-17 (×2): 50 mg via ORAL
  Filled 2012-02-16 (×2): qty 1

## 2012-02-16 MED ORDER — PANTOPRAZOLE SODIUM 40 MG PO TBEC
40.0000 mg | DELAYED_RELEASE_TABLET | Freq: Every day | ORAL | Status: DC
  Administered 2012-02-16 – 2012-02-17 (×2): 40 mg via ORAL
  Filled 2012-02-16 (×2): qty 1

## 2012-02-16 MED ORDER — DULOXETINE HCL 60 MG PO CPEP
60.0000 mg | ORAL_CAPSULE | Freq: Every day | ORAL | Status: DC
  Administered 2012-02-16 – 2012-02-17 (×2): 60 mg via ORAL
  Filled 2012-02-16 (×2): qty 1

## 2012-02-16 NOTE — Clinical Social Work Note (Signed)
Accepted to [facility] by Donell Sievert, pending bed availability. Vickii Penna, LCSWA [phone] Clinical Social Work

## 2012-02-16 NOTE — ED Notes (Signed)
Received package of items in a brown plastic bag with a black plastic bag in it, vanilla envelope with multiple envelopes. Will review patient belongs with security.

## 2012-02-16 NOTE — BH Assessment (Signed)
Assessment Note   [name] is an 56 y.o. female who presented as a walk-in after emptying her bank accounts and closing them, and leaving her mother with a list of instructions of how she wanted her 'loose ends' tied up. She then went to a parking garage and went to the eighth floor and thought about jumping, but did not because the height was intimidating, and she came to [facility]. Her husband died suddenly one year ago on 2011-02-27, and she has been struggling financially, having to sell her home.  Axis I: Major Depression, Recurrent severe Axis II: No diagnosis Axis III:  Past Medical History  Diagnosis Date  . Weight gain   . Night sweats   . Fatigue     due to loss of sleep  . Herpes   . Diabetes mellitus   . GERD (gastroesophageal reflux disease)   . Hx of blood clots   . Contact lens/glasses fitting   . Morbid obesity   . Pulmonary embolism 2009  . Fracture of right heel 2006  . 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 Depression   . Suicidal ideation    Axis IV: problems with primary support group Axis V: 31-40 impairment in reality testing  Past Medical History:  Past Medical History  Diagnosis Date  . Weight gain   . Night sweats   . Fatigue     due to loss of sleep  . Herpes   . Diabetes mellitus   . GERD (gastroesophageal reflux disease)   . Hx of blood clots   . Contact lens/glasses fitting   . Morbid obesity   . Pulmonary embolism 2009  . Fracture of right heel 2006  . 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 Depression   . Suicidal ideation     Past Surgical History  Procedure Date  . Eye surgery 03/2001    lasik  . Hysteroscopy 2002  . Laparoscopy 2002  . Abdominal hysterectomy 2003  . Pseudotumor cerebri   . Breath tek h pylori 01/09/2012    Procedure: BREATH TEK H PYLORI;  Surgeon: Kandis Cocking, MD;  Location: [facility];  Service: General;  Laterality: N/A;    Family History:  Family History    Problem Relation Age of Onset  . Adopted: Yes    Social History:  reports that she has never smoked. She has never used smokeless tobacco. She reports that she does not drink alcohol or use illicit drugs.  Additional Social History:  Alcohol / Drug Use History of alcohol / drug use?: No history of alcohol / drug abuse  CIWA:   COWS:    Allergies:  Allergies  Allergen Reactions  . Metformin And Related Diarrhea  . Tegretol (Carbamazepine)     Home Medications:  (Not in a hospital admission)  OB/GYN Status:  No LMP recorded. Patient has had a hysterectomy.  General Assessment Data Location of Assessment: [facility] Assessment Services Living Arrangements: Alone Can pt return to current living arrangement?: Yes Admission Status: Voluntary Is patient capable of signing voluntary admission?: Yes Transfer from: Home Referral Source: Self/Family/Friend  Education Status Is patient currently in school?: No  Risk to self Suicidal Ideation: Yes-Currently Present Suicidal Intent: Yes-Currently Present Is patient at risk for suicide?: Yes Suicidal Plan?: Yes-Currently Present Specify Current Suicidal Plan:  (to jump off a parking garage) Access to Means: Yes Specify Access to Suicidal Means:  (  parking garage) What has been your use of drugs/alcohol within the last 12 months?: 0 Previous Attempts/Gestures: No Other Self Harm Risks: none Intentional Self Injurious Behavior: None Family Suicide History: Unknown Recent stressful life event(s): Loss (Comment) (husband died one year ago) Persecutory voices/beliefs?: No Depression: Yes Depression Symptoms: Despondent;Isolating;Fatigue Substance abuse history and/or treatment for substance abuse?: No Suicide prevention information given to non-admitted patients: Not applicable  Risk to Others Homicidal Ideation: No Thoughts of Harm to Others: No Current Homicidal Intent: No Current Homicidal Plan: No Access to Homicidal Means:  No History of harm to others?: No Assessment of Violence: None Noted Criminal Charges Pending?: No Does patient have a court date: No  Psychosis Hallucinations: None noted Delusions: None noted  Mental Status Report Appear/Hygiene: Other (Comment) (neat) Eye Contact: Good Motor Activity: Unremarkable Speech: Rapid Level of Consciousness: Alert Mood: Depressed Affect: Appropriate to circumstance Anxiety Level: None Thought Processes: Coherent Judgement: Impaired Orientation: Person;Place;Time;Situation Obsessive Compulsive Thoughts/Behaviors: None  Cognitive Functioning Concentration: Normal Memory: Recent Intact;Remote Intact Insight: Fair Impulse Control: Fair Appetite: Good Sleep: No Change Vegetative Symptoms: None  ADLScreening [facility] Assessment Services) Patient's cognitive ability adequate to safely complete daily activities?: Yes Patient able to express need for assistance with ADLs?: Yes Independently performs ADLs?: Yes (appropriate for developmental age)  Abuse/Neglect [facility]) Physical Abuse: Denies Verbal Abuse: Denies Sexual Abuse: Denies  Prior Inpatient Therapy Prior Inpatient Therapy: Yes Prior Therapy Dates: 2002 Prior Therapy Facilty/Provider(s): [facility] Reason for Treatment:  (suicidal thoughts)  Prior Outpatient Therapy Prior Outpatient Therapy: Yes Prior Therapy Dates: 2003 Prior Therapy Facilty/Provider(s): [facility] Reason for Treatment: suicidal thoughts  ADL Screening (condition at time of admission) Patient's cognitive ability adequate to safely complete daily activities?: Yes Patient able to express need for assistance with ADLs?: Yes Independently performs ADLs?: Yes (appropriate for developmental age) Weakness of Legs: None Weakness of Arms/Hands: None       Abuse/Neglect Assessment (Assessment to be complete while patient is alone) Physical Abuse: Denies Verbal Abuse: Denies Sexual Abuse: Denies Exploitation of patient/patient's  resources: Denies Self-Neglect: Denies       Nutrition Screen- [facility] Adult/WL/AP Patient's home diet: Regular Have you recently lost weight without trying?: No Have you been eating poorly because of a decreased appetite?: No Malnutrition Screening Tool Score: 0   Additional Information 1:1 In Past 12 Months?: No CIRT Risk: No Elopement Risk: No Does patient have medical clearance?: No     Disposition:  Disposition Disposition of Patient: Referred to ([facility] for med clearance) Patient referred to:  [facility] for med clearance)  On Site Evaluation by:   Reviewed with Physician:     Billy Coast 02/16/2012 1:54 AM

## 2012-02-16 NOTE — ED Notes (Signed)
Pt states she went to [facility] tonight because she almost committed suicide.    Pt states she closed out her bank account and went to parking deck.  Went to 8th floor and debated jumping.  Got back in car and drove to [facility]  Has left information and letters on Mom's front door.  Pt states needs to get those back before 7 am when mom wakes up.  Pt states husband died last year.  Pt states no previous attempts but has been treated for same before.

## 2012-02-16 NOTE — ED Notes (Signed)
Pt talking with staff and  laughing. Pt is complaining of anxiety cause by other patients on the unit. Pt reminiscing about christmas and making plans for this christmas with her mom and sons.

## 2012-02-16 NOTE — ED Notes (Signed)
Patient mother came to see patient. Family given visiting hours and telephone hours. Will return during set time.

## 2012-02-16 NOTE — ED Notes (Signed)
Offered pt. A shower, pt. Declined at this time. 

## 2012-02-16 NOTE — ED Notes (Signed)
Patient personal valuable envelope has be placed in box 2 with security officer crawford. Patient item list has been placed in chart with key to security box.

## 2012-02-16 NOTE — ED Provider Notes (Signed)
History     CSN: [id number] Arrival date & time 02/16/12  0112 First MD Initiated Contact with Patient 02/16/12 [phone]      Chief Complaint  Patient presents with  . Medical Clearance    HPI The patient presents to the emergency room with suicidal ideation. The patient has had a history of depression off and on for years. She states many times the symptoms were exacerbated after some sort of relationship issue. The patient's husband died last year.  Patient has been more depressed today she went and closed her bank account. She also wrote a suicide letter and left it on her mother's doorstep. She also drove to a multi-story parking deck downtown and went up to the eighth floor.  The patient and do that someone had committed suicide after jumping off this parking deck several months ago. The patient walked towards the edge of a deck unfortunately was not able to jump off. She then drove herself to behavioral health to get help Past Medical History  Diagnosis Date  . Weight gain   . Night sweats   . Fatigue     due to loss of sleep  . Herpes   . Diabetes mellitus   . GERD (gastroesophageal reflux disease)   . Hx of blood clots   . Contact lens/glasses fitting   . Morbid obesity   . Pulmonary embolism 2009  . Fracture of right heel 2006  . 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 Depression   . Suicidal ideation     Past Surgical History  Procedure Date  . Eye surgery 03/2001    lasik  . Hysteroscopy 2002  . Laparoscopy 2002  . Abdominal hysterectomy 2003  . Pseudotumor cerebri   . Breath tek h pylori 01/09/2012    Procedure: BREATH TEK H PYLORI;  Surgeon: Kandis Cocking, MD;  Location: [facility];  Service: General;  Laterality: N/A;    Family History  Problem Relation Age of Onset  . Adopted: Yes    History  Substance Use Topics  . Smoking status: Never Smoker   . Smokeless tobacco: Never Used  . Alcohol Use: No    OB History    Grav Para  Term Preterm Abortions TAB SAB Ect Mult Living                  Review of Systems  All other systems reviewed and are negative.    Allergies  Metformin and related and Tegretol  Home Medications   Current Outpatient Rx  Name Route Sig Dispense Refill  . ALPRAZOLAM 1 MG PO TABS Oral Take 1 mg by mouth 4 (four) times daily as needed. For anxiety    . ASPIRIN 81 MG PO TABS Oral Take 81 mg by mouth daily.    . BUPROPION HCL ER (XL) 150 MG PO TB24 Oral Take 150 mg by mouth Daily.     . CYMBALTA 60 MG PO CPEP Oral Take 60 mg by mouth Daily.     Marland Kitchen GLIMEPIRIDE 2 MG PO TABS Oral Take 4 mg by mouth Daily.     Marland Kitchen LOSARTAN POTASSIUM 50 MG PO TABS Oral Take 50 mg by mouth Daily.     Marland Kitchen MIRTAZAPINE 15 MG PO TABS Oral Take 15 mg by mouth at bedtime.    . OMEPRAZOLE 20 MG PO CPDR Oral Take 20 mg by mouth Daily.     Marland Kitchen SIMVASTATIN 20 MG PO TABS  Oral Take 20 mg by mouth at bedtime.       BP 153/69  Pulse 95  Temp 98.4 F (36.9 C) (Oral)  Resp 20  Ht 5\' 7"  (1.702 m)  Wt 250 lb (113.399 kg)  BMI 39.16 kg/m2  SpO2 95%  Physical Exam  Nursing note and vitals reviewed. Constitutional: She appears well-developed and well-nourished. No distress.       Obese  HENT:  Head: Normocephalic and atraumatic.  Right Ear: External ear normal.  Left Ear: External ear normal.  Eyes: Conjunctivae normal are normal. Right eye exhibits no discharge. Left eye exhibits no discharge. No scleral icterus.  Neck: Neck supple. No tracheal deviation present.  Cardiovascular: Normal rate, regular rhythm and intact distal pulses.   Pulmonary/Chest: Effort normal and breath sounds normal. No stridor. No respiratory distress. She has no wheezes. She has no rales.  Abdominal: Soft. Bowel sounds are normal. She exhibits no distension. There is no tenderness. There is no rebound and no guarding.  Musculoskeletal: She exhibits no edema and no tenderness.  Neurological: She is alert. She has normal strength. No sensory  deficit. Cranial nerve deficit:  no gross defecits noted. She exhibits normal muscle tone. She displays no seizure activity. Coordination normal.  Skin: Skin is warm and dry. Rash (Rash consistent with psoriasis on extremities) noted.  Psychiatric: Her speech is not delayed. She is not agitated. She exhibits a depressed mood. She expresses suicidal ideation.    ED Course  Procedures (including critical care time)  Labs Reviewed  CBC - Abnormal; Notable for the following:    WBC 14.1 (*)     All other components within normal limits  COMPREHENSIVE METABOLIC PANEL - Abnormal; Notable for the following:    Alkaline Phosphatase 137 (*)     Total Bilirubin 0.2 (*)     GFR calc non Af Amer 66 (*)     GFR calc Af Amer 77 (*)     All other components within normal limits  SALICYLATE LEVEL - Abnormal; Notable for the following:    Salicylate Lvl <2.0 (*)     All other components within normal limits  URINE RAPID DRUG SCREEN (HOSP PERFORMED) - Abnormal; Notable for the following:    Benzodiazepines POSITIVE (*)     All other components within normal limits  ETHANOL  ACETAMINOPHEN LEVEL   No results found.     MDM  Patient has a mild leukocytosis but no other symptoms to suggest an acute bacterial infection.  I will add on a urinalysis but she is medically cleared at this time        Celene Kras, MD 02/16/12 [phone]

## 2012-02-16 NOTE — ED Provider Notes (Signed)
Pt resting comfortably, nad. Act eval/placement pending.   Suzi Roots, MD 02/16/12 [phone]

## 2012-02-16 NOTE — ED Notes (Signed)
Patient gave mothers phone number- [phone] and address [address], [place], [place] . Dr Lynelle Doctor recommended police go to patients mothers house to remove the suicidal letter from the front door. Awaiting a call back from sheriff department regarding request.

## 2012-02-17 ENCOUNTER — Inpatient Hospital Stay (HOSPITAL_COMMUNITY)
Admission: AD | Admit: 2012-02-17 | Discharge: 2012-02-21 | DRG: 885 | Disposition: A | Discharge status: Home / Self Care | Payer: 59 | Source: Ambulatory Visit | Attending: Psychiatry | Admitting: Psychiatry

## 2012-02-17 DIAGNOSIS — F332 Major depressive disorder, recurrent severe without psychotic features: Principal | ICD-10-CM | POA: Diagnosis present

## 2012-02-17 DIAGNOSIS — Z79899 Other long term (current) drug therapy: Secondary | ICD-10-CM

## 2012-02-17 DIAGNOSIS — Z86711 Personal history of pulmonary embolism: Secondary | ICD-10-CM

## 2012-02-17 DIAGNOSIS — Z7982 Long term (current) use of aspirin: Secondary | ICD-10-CM

## 2012-02-17 DIAGNOSIS — E119 Type 2 diabetes mellitus without complications: Secondary | ICD-10-CM | POA: Diagnosis present

## 2012-02-17 DIAGNOSIS — F411 Generalized anxiety disorder: Secondary | ICD-10-CM | POA: Diagnosis present

## 2012-02-17 DIAGNOSIS — R45851 Suicidal ideations: Secondary | ICD-10-CM

## 2012-02-17 LAB — URINALYSIS, ROUTINE W REFLEX MICROSCOPIC
Bilirubin Urine: NEGATIVE
Glucose, UA: NEGATIVE mg/dL
Hgb urine dipstick: NEGATIVE
Ketones, ur: NEGATIVE mg/dL
Nitrite: NEGATIVE
Protein, ur: NEGATIVE mg/dL
Specific Gravity, Urine: 1.02 (ref 1.005–1.030)
Urobilinogen, UA: 0.2 mg/dL (ref 0.0–1.0)
pH: 6.5 (ref 5.0–8.0)

## 2012-02-17 LAB — URINE MICROSCOPIC-ADD ON

## 2012-02-17 NOTE — BHH Counselor (Signed)
Requested by [facility] to have pt checked for UTI. Discussed with EDP who will order UA.

## 2012-02-17 NOTE — ED Notes (Signed)
Per RN request gave pt contact case so she could remove her contacts before she went to bed

## 2012-02-17 NOTE — ED Provider Notes (Signed)
Filed Vitals:   02/17/12 0601  BP: 155/72  Pulse: 80  Temp: 97.6 F (36.4 C)  Resp: 17   Pt seen and assessed. No new complaints. Accepted at [facility]. Awaiting bed.  Raeford Razor, MD 02/17/12 [phone]

## 2012-02-18 ENCOUNTER — Encounter (HOSPITAL_COMMUNITY): Payer: Self-pay

## 2012-02-18 DIAGNOSIS — F332 Major depressive disorder, recurrent severe without psychotic features: Principal | ICD-10-CM

## 2012-02-18 DIAGNOSIS — F329 Major depressive disorder, single episode, unspecified: Secondary | ICD-10-CM

## 2012-02-18 LAB — GLUCOSE, CAPILLARY

## 2012-02-18 MED ORDER — PANTOPRAZOLE SODIUM 40 MG PO TBEC
40.0000 mg | DELAYED_RELEASE_TABLET | Freq: Every day | ORAL | Status: DC
  Administered 2012-02-18 – 2012-02-21 (×4): 40 mg via ORAL
  Filled 2012-02-18 (×5): qty 1

## 2012-02-18 MED ORDER — LOSARTAN POTASSIUM 50 MG PO TABS
50.0000 mg | ORAL_TABLET | Freq: Every day | ORAL | Status: DC
  Administered 2012-02-18 – 2012-02-21 (×4): 50 mg via ORAL
  Filled 2012-02-18 (×5): qty 1

## 2012-02-18 MED ORDER — ACETAMINOPHEN 325 MG PO TABS
650.0000 mg | ORAL_TABLET | Freq: Four times a day (QID) | ORAL | Status: DC | PRN
  Administered 2012-02-18: 650 mg via ORAL

## 2012-02-18 MED ORDER — TRAZODONE HCL 100 MG PO TABS
100.0000 mg | ORAL_TABLET | Freq: Every day | ORAL | Status: DC
  Administered 2012-02-18: 100 mg via ORAL
  Filled 2012-02-18 (×3): qty 1

## 2012-02-18 MED ORDER — ALPRAZOLAM 1 MG PO TABS
1.0000 mg | ORAL_TABLET | Freq: Four times a day (QID) | ORAL | Status: DC | PRN
  Administered 2012-02-18 – 2012-02-20 (×9): 1 mg via ORAL
  Filled 2012-02-18 (×8): qty 1

## 2012-02-18 MED ORDER — GLIMEPIRIDE 4 MG PO TABS
4.0000 mg | ORAL_TABLET | Freq: Every day | ORAL | Status: DC
  Filled 2012-02-18 (×2): qty 1

## 2012-02-18 MED ORDER — TRAZODONE HCL 100 MG PO TABS
100.0000 mg | ORAL_TABLET | Freq: Every day | ORAL | Status: DC
  Administered 2012-02-18 – 2012-02-20 (×3): 100 mg via ORAL
  Filled 2012-02-18 (×4): qty 1

## 2012-02-18 MED ORDER — SIMVASTATIN 20 MG PO TABS
20.0000 mg | ORAL_TABLET | Freq: Every day | ORAL | Status: DC
  Administered 2012-02-18 – 2012-02-20 (×3): 20 mg via ORAL
  Filled 2012-02-18 (×4): qty 1

## 2012-02-18 MED ORDER — ASPIRIN EC 81 MG PO TBEC
81.0000 mg | DELAYED_RELEASE_TABLET | Freq: Every day | ORAL | Status: DC
  Administered 2012-02-18 – 2012-02-20 (×3): 81 mg via ORAL
  Filled 2012-02-18 (×4): qty 1

## 2012-02-18 MED ORDER — MIRTAZAPINE 15 MG PO TABS
15.0000 mg | ORAL_TABLET | Freq: Every day | ORAL | Status: DC
  Administered 2012-02-18 – 2012-02-20 (×3): 15 mg via ORAL
  Filled 2012-02-18 (×4): qty 1

## 2012-02-18 MED ORDER — GLIMEPIRIDE 4 MG PO TABS
4.0000 mg | ORAL_TABLET | Freq: Every day | ORAL | Status: DC
  Administered 2012-02-18 – 2012-02-21 (×4): 4 mg via ORAL
  Filled 2012-02-18 (×5): qty 1

## 2012-02-18 MED ORDER — MIRTAZAPINE 15 MG PO TABS
15.0000 mg | ORAL_TABLET | Freq: Every day | ORAL | Status: DC
  Administered 2012-02-18: 15 mg via ORAL
  Filled 2012-02-18 (×3): qty 1

## 2012-02-18 MED ORDER — ALUM & MAG HYDROXIDE-SIMETH 200-200-20 MG/5ML PO SUSP: 30.0000 mL | ORAL | Status: DC | PRN

## 2012-02-18 MED ORDER — MAGNESIUM HYDROXIDE 400 MG/5ML PO SUSP: 30.0000 mL | Freq: Every day | ORAL | Status: DC | PRN

## 2012-02-18 MED ORDER — ALPRAZOLAM 0.5 MG PO TABS
ORAL_TABLET | ORAL | Status: AC
  Administered 2012-02-18: 1 mg via ORAL
  Filled 2012-02-18: qty 2
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
## 2012-02-18 NOTE — Progress Notes (Signed)
D) Pt has attended the program and interacts with peers appropriately. Denies SI and HI. Rates her depression and hopelessness both at an 8. States that she takes Xanax at home 4 times a day and the same has been prescribed to Pt here/ A) Given support, reassurnace and praise. R) Denies Si and HI.

## 2012-02-18 NOTE — Tx Team (Signed)
Initial Interdisciplinary Treatment Plan  PATIENT STRENGTHS: (choose at least two) Active sense of humor Average or above average intelligence Capable of independent living Communication skills Financial means General fund of knowledge   PATIENT STRESSORS: Loss of spouse* Marital or family conflict   PROBLEM LIST: Problem List/Patient Goals Date to be addressed Date deferred Reason deferred Estimated date of resolution  Depression      SI now denies SI                                                 DISCHARGE CRITERIA:  Ability to meet basic life and health needs Improved stabilization in mood, thinking, and/or behavior Medical problems require only outpatient monitoring Motivation to continue treatment in a less acute level of care Need for constant or close observation no longer present  PRELIMINARY DISCHARGE PLAN: Attend PHP/IOP Outpatient therapy Participate in family therapy  PATIENT/FAMIILY INVOLVEMENT: This treatment plan has been presented to and reviewed with the patient, INIS BORNEMAN, and/or family member.  The patient and family have been given the opportunity to ask questions and make suggestions.  Cooper Render 02/18/2012, 2:01 AM

## 2012-02-18 NOTE — [facility] Suicide Risk Assessment (Signed)
Suicide Risk Assessment  Admission Assessment     Nursing information obtained from:    Demographic factors:    Current Mental Status:    Loss Factors:    Historical Factors:    Risk Reduction Factors:     CLINICAL FACTORS:   Depression:   Anhedonia Hopelessness Impulsivity Insomnia Recent sense of peace/wellbeing Severe Dysthymia  COGNITIVE FEATURES THAT CONTRIBUTE TO RISK:  Closed-mindedness Polarized thinking    SUICIDE RISK:   Mild:  Suicidal ideation of limited frequency, intensity, duration, and specificity.  There are no identifiable plans, no associated intent, mild dysphoria and related symptoms, good self-control (both objective and subjective assessment), few other risk factors, and identifiable protective factors, including available and accessible social support.  PLAN OF CARE: Admitted for depression and suicidal thoughts. Patient has sleep difficulties and nervousness with few medications.    Jyron Turman,JANARDHAHA R. 02/18/2012, 3:34 PM

## 2012-02-18 NOTE — Progress Notes (Signed)
Pt. Reports to RN that she  Was dwelling over the anniversary of the passing of her husband in Sept. And then her youngest son became angry with her and would not speak to her.  She states that it was overwhelming and she could not stand for anyone to be mad at her especially one of her children.  She says that she has been at [facility] for three days now and has had time to realize that it was all a mistake and she would not take her own life.  Pt. Was calm and cooperative during the admission process.

## 2012-02-18 NOTE — Progress Notes (Signed)
[facility] Group Notes:  (Counselor/Nursing/MHT/Case Management/Adjunct)  02/18/2012 2:38 PM  Type of Therapy:  Group Therapy  Participation Level:  Active  Participation Quality:  Appropriate  Affect:  Appropriate  Cognitive:  Appropriate  Insight:  Good  Engagement in Group:  Good  Engagement in Therapy:  Good  Modes of Intervention:  Support, Socialization, Clarification  Summary of Progress/Problems:Pt. participated in group on self sabotaging behaviors and what changes they need to make in their lives. The group focused on changing the way they think and being positive.  The pt. Spoke about finding her focus and how she would do this. The pt. was told by the therapist to explore her life and to attend support groups.    Lamar Blinks Van Voorhis 02/18/2012, 2:38 PM

## 2012-02-19 DIAGNOSIS — F410 Panic disorder [episodic paroxysmal anxiety] without agoraphobia: Secondary | ICD-10-CM

## 2012-02-19 DIAGNOSIS — F411 Generalized anxiety disorder: Secondary | ICD-10-CM

## 2012-02-19 LAB — GLUCOSE, CAPILLARY: Glucose-Capillary: 154 mg/dL — ABNORMAL HIGH (ref 70–99)

## 2012-02-19 MED ORDER — DULOXETINE HCL 60 MG PO CPEP
60.0000 mg | ORAL_CAPSULE | Freq: Every day | ORAL | Status: DC
  Administered 2012-02-19 – 2012-02-20 (×2): 60 mg via ORAL
  Filled 2012-02-19 (×5): qty 1

## 2012-02-19 NOTE — Progress Notes (Signed)
[facility] Group Notes:  (Counselor/Nursing/MHT/Case Management/Adjunct)  02/19/2012 1:08 AM  Type of Therapy:  Psychoeducational Skills  Participation Level:  Minimal  Participation Quality:  Attentive  Affect:  Appropriate  Cognitive:  Appropriate  Insight:  Limited  Engagement in Group:  Limited  Engagement in Therapy:  Limited  Modes of Intervention:  Education  Summary of Progress/Problems: The patient had little to share in group. Her goal for tomorrow is to "release" her past feelings.    Norelle Runnion S 02/19/2012, 1:08 AM

## 2012-02-19 NOTE — Progress Notes (Signed)
Psychoeducational Group Note  Date:  02/19/2012 Time:  1515  Group Topic/Focus:  Conflict Resolution:   The focus of this group is to discuss the conflict resolution process and how it may be used upon discharge.  Participation Level:  Active  Participation Quality:  Appropriate and Attentive  Affect:  Appropriate  Cognitive:  Alert and Appropriate  Insight:  Good  Engagement in Group:  Good  Additional Comments:   Pt attended and participated in group.   Dalia Heading 02/19/2012, 8:18 PM

## 2012-02-19 NOTE — Progress Notes (Signed)
Psychoeducational Group Note  Date:  02/19/2012 Time: 1015 Group Topic/Focus:  Making Healthy Choices:   The focus of this group is to help patients identify negative/unhealthy choices they were using prior to admission and identify positive/healthier coping strategies to replace them upon discharge.  Participation Level:  Active  Participation Quality:  Appropriate  Affect:  Appropriate  Cognitive:  Alert  Insight:  Good  Engagement in Group:  Good  Additional Comments:    Rich Brave 8:11 PM. 02/19/2012

## 2012-02-19 NOTE — H&P (Signed)
Reviewed document and agree with the treatment recommendations 

## 2012-02-19 NOTE — Progress Notes (Signed)
D)Pt has been attending the groups and interacting appropriately with her peers. States that she has done a lot of changing over the last few days. Is proud of herself and wants to continue to grow and change. Denies SI and HI. Denies depression and hopelessness. States I see everything different now. A) Given support and positive feedback and praise. R) States she is feeling ready to go home and to start her new learned skills.

## 2012-02-19 NOTE — Progress Notes (Signed)
[facility] Group Notes:  (Counselor/Nursing/MHT/Case Management/Adjunct)  02/19/2012 10:36 AM  Type of Therapy: Group Therapy   Participation Level: Active   Participation Quality: Appropriate   Affect: Appropriate   Cognitive: Appropriate   Insight: Good   Engagement in Group: Good   Engagement in Therapy: Good   Modes of Intervention: Support, Socialization, Clarification  Summary of Progress/Problems:Pt. participated in group on healthy support systems by identifying the the healthy supports within their individual lives. The group focused on how to reach out to supports without feeling like a burden. Pt expressed feeling like she has to accept that her family is supporting her the best way they know how and plans to continue to build her support system.  [name] 02/19/2012, 10:36 AM

## 2012-02-19 NOTE — Progress Notes (Signed)
Spoke with pt 1:1 who is pleasant and smiling. Pt appears to have bonded well with some of her female peers as they are supportive of each other. She complained of pain in her legs bilat which resolved with tylenol. She reports feeling better today. "Things are starting to make sense. They are starting to come together." Pt given emotional support. Given xanax per her request at bed time which was effective. She denies SI/HI/AVH and remains safe. [name]

## 2012-02-19 NOTE — Progress Notes (Signed)
[facility] MD Progress Note  02/19/2012 11:50 AM  Diagnosis:  Axis I: Generalized Anxiety Disorder, Major Depression, Recurrent severe and Panic Disorder  ADL's:  Intact  Sleep: Fair  Appetite:  Fair  Suicidal Ideation:  suicidal thought with intension to kill herself by jumping off of the building or bridge Homicidal Ideation:  Denied homicidal ideations, intension and plans  AEB (as evidenced by):Patient has been depressed, anxious and extremely tired today due to not taking her antidepressant medication and requested to start today. She is participating on unit problems. Her roommate snoring having hard time with sleep.  Mental Status Examination/Evaluation: Objective:  Appearance: Casual and Fairly Groomed  Patent attorney::  Fair  Speech:  Clear and Coherent  Volume:  Decreased  Mood:  Anxious and Depressed  Affect:  Appropriate and Congruent  Thought Process:  Coherent and Goal Directed  Orientation:  Full  Thought Content:  WDL  Suicidal Thoughts:  Yes.  with intent/plan  Homicidal Thoughts:  No  Memory:  Immediate;   Fair  Judgement:  Intact  Insight:  Lacking  Psychomotor Activity:  Psychomotor Retardation  Concentration:  Fair  Recall:  Fair  Akathisia:  No  Handed:  Right  AIMS (if indicated):     Assets:  Communication Skills Desire for Improvement Physical Health Resilience Social Support Transportation  Sleep:  Number of Hours: 6.5    Vital Signs:Blood pressure 123/70, pulse 98, temperature 97.7 F (36.5 C), temperature source Oral, resp. rate 18, height 5\' 7"  (1.702 m). Current Medications: Current Facility-Administered Medications  Medication Dose Route Frequency Provider Last Rate Last Dose  . acetaminophen (TYLENOL) tablet 650 mg  650 mg Oral Q6H PRN Caroleen Hamman, NP   650 mg at 02/18/12 2039  . ALPRAZolam (XANAX) tablet 1 mg  1 mg Oral QID PRN Mickie D. Adams, PA   1 mg at 02/19/12 0842  . alum & mag hydroxide-simeth (MAALOX/MYLANTA) 200-200-20 MG/5ML  suspension 30 mL  30 mL Oral Q4H PRN Caroleen Hamman, NP      . aspirin EC tablet 81 mg  81 mg Oral QHS Mickie D. Adams, PA   81 mg at 02/18/12 2218  . DULoxetine (CYMBALTA) DR capsule 60 mg  60 mg Oral Daily Nehemiah Settle, MD      . glimepiride (AMARYL) tablet 4 mg  4 mg Oral QAC breakfast Rachael Fee, MD   4 mg at 02/19/12 0840  . losartan (COZAAR) tablet 50 mg  50 mg Oral Daily Mickie D. Adams, PA   50 mg at 02/19/12 0840  . magnesium hydroxide (MILK OF MAGNESIA) suspension 30 mL  30 mL Oral Daily PRN Caroleen Hamman, NP      . mirtazapine (REMERON) tablet 15 mg  15 mg Oral QHS Mickie D. Adams, PA   15 mg at 02/18/12 2218  . pantoprazole (PROTONIX) EC tablet 40 mg  40 mg Oral Daily Mickie D. Adams, PA   40 mg at 02/19/12 0840  . simvastatin (ZOCOR) tablet 20 mg  20 mg Oral QHS Mickie D. Adams, PA   20 mg at 02/18/12 2218  . traZODone (DESYREL) tablet 100 mg  100 mg Oral QHS Mickie D. Adams, PA   100 mg at 02/18/12 2218  . DISCONTD: glimepiride (AMARYL) tablet 4 mg  4 mg Oral QAC breakfast Mickie D. Adams, [place]        Lab Results:  Results for orders placed during the hospital encounter of 02/17/12 (from the past 48 hour(s))  GLUCOSE, CAPILLARY  Status: Abnormal   Collection Time   02/18/12 12:06 PM      Component Value Range Comment   Glucose-Capillary 116 (*) 70 - 99 mg/dL    Comment 1 Documented in Chart      Comment 2 Notify RN     GLUCOSE, CAPILLARY     Status: Abnormal   Collection Time   02/18/12  4:41 PM      Component Value Range Comment   Glucose-Capillary 106 (*) 70 - 99 mg/dL    Comment 1 Documented in Chart      Comment 2 Notify RN       Physical Findings: AIMS: Facial and Oral Movements Muscles of Facial Expression: None, normal Lips and Perioral Area: None, normal Jaw: None, normal Tongue: None, normal,Extremity Movements Upper (arms, wrists, hands, fingers): None, normal Lower (legs, knees, ankles, toes): None, normal, Trunk Movements Neck,  shoulders, hips: None, normal, Overall Severity Severity of abnormal movements (highest score from questions above): None, normal Incapacitation due to abnormal movements: None, normal Patient's awareness of abnormal movements (rate only patient's report): No Awareness, Dental Status Current problems with teeth and/or dentures?: No Does patient usually wear dentures?: No  CIWA:  CIWA-Ar Total: 0  COWS:  COWS Total Score: 0   Treatment Plan Summary: Daily contact with patient to assess and evaluate symptoms and progress in treatment Medication management  Plan: Start Cymbalta 60 mg i PO QD along with other medication treatment.   [name],JANARDHAHA R. 02/19/2012, 11:50 AM

## 2012-02-19 NOTE — Discharge Planning (Signed)
Pt was seen this morning during discharge planning group.  Pt stated that she slept well and appetite is good.  Pt rated depression level at a zero and anxiety level at an two.  Pt denies SI/HI/AVH.

## 2012-02-20 LAB — GLUCOSE, CAPILLARY

## 2012-02-20 MED ORDER — DULOXETINE HCL 30 MG PO CPEP
30.0000 mg | ORAL_CAPSULE | Freq: Every day | ORAL | Status: DC
  Administered 2012-02-21: 30 mg via ORAL
  Filled 2012-02-20 (×2): qty 1

## 2012-02-20 NOTE — Progress Notes (Signed)
Met with pt 1:1 who continues to report improvement in mood and thinking. She states since being here she realizes she has many good things to live for. She knows that her main goal upon discharge will be to increase her support system. She is comforting to her female peers while also remaining focused on self. Attending groups, vested in treatment. Affect is appropriate as is mood. Encouraged and supported. Medicated per orders and given xanax prn which is effective. Denies Si/HI/AVH and remains safe. Lawrence Marseilles

## 2012-02-20 NOTE — Progress Notes (Addendum)
[facility] Adult Inpatient Family/Significant Other Suicide Prevention Education  Suicide Prevention Education:  Education Completed; Margarito Courser, mother, [phone], has been identified by the patient as the family member/significant other with whom the patient will be residing, and identified as the person(s) who will aid the patient in the event of a mental health crisis (suicidal ideations/suicide attempt).  With written consent from the patient, the family member/significant other has been provided the following suicide prevention education, prior to the and/or following the discharge of the patient.  The suicide prevention education provided includes the following:  Suicide risk factors  Suicide prevention and interventions  National Suicide Hotline telephone number  [facility] assessment telephone number  [facility] Emergency Assistance 911  [facility] and/or Residential Mobile Crisis Unit telephone number  Request made of family/significant other to:  Remove weapons (e.g., guns, rifles, knives), all items previously/currently identified as safety concern.    Remove drugs/medications (over-the-counter, prescriptions, illicit drugs), all items previously/currently identified as a safety concern.  The family member/significant other verbalizes understanding of the suicide prevention education information provided.  The family member/significant other agrees to remove the items of safety concern listed above.  Daryel Gerald B 02/20/2012, 5:14 PM

## 2012-02-20 NOTE — Progress Notes (Signed)
Patient ID: [name], female   DOB: 06/14/1955, 55 y.o.   MRN: [id number] [facility] MD Progress Note  02/20/2012 4:20 PM  Diagnosis:  Axis I: Generalized Anxiety Disorder, Major Depression, Recurrent severe and Panic Disorder  ADL's:  Intact  Sleep: Good  Appetite:  Good  Suicidal Ideation:  suicidal thought with intension to kill herself by jumping off of the building or bridge  Homicidal Ideation:  Denied homicidal ideations, intension and plans  AEB (as evidenced by):Patient states that she is feeling extremely well rates anxiety and depression levels 0/10.  States that she is ready for D/C.  Patient states that she has discovered that she will need to find support out side of her family that can help her manage her anxiety and depression such as OP therapy/counseling.  During interview with patient she was all over unable to concentrate on topic at hand.  Continues to participate in group and states that it has helped her a lot.  Mental Status Examination/Evaluation: Objective:  Appearance: Casual and Fairly Groomed  Patent attorney::  Fair  Speech:  Clear and Coherent  Volume:  Decreased  Mood:  Anxious and Depressed  Affect:  Appropriate and Congruent  Thought Process:  Coherent and Goal Directed and rumination   Orientation:  Full  Thought Content:  WDL  Suicidal Thoughts:  Yes.  with intent/plan  Homicidal Thoughts:  No  Memory:  Immediate;   Fair  Judgement:  Intact  Insight:  Lacking  Psychomotor Activity:  Psychomotor Retardation  Concentration:  Fair  Recall:  Fair  Akathisia:  No  Handed:  Right  AIMS (if indicated):     Assets:  Communication Skills Desire for Improvement Physical Health Resilience Social Support Transportation  Sleep:  Number of Hours: 6.25    Vital Signs:Blood pressure 152/77, pulse 106, temperature 97.9 F (36.6 C), temperature source Oral, resp. rate 16, height 5\' 7"  (1.702 m). Current Medications: Current Facility-Administered  Medications  Medication Dose Route Frequency Provider Last Rate Last Dose  . acetaminophen (TYLENOL) tablet 650 mg  650 mg Oral Q6H PRN Caroleen Hamman, NP   650 mg at 02/18/12 2039  . ALPRAZolam (XANAX) tablet 1 mg  1 mg Oral QID PRN Mickie D. Adams, PA   1 mg at 02/20/12 4098  . alum & mag hydroxide-simeth (MAALOX/MYLANTA) 200-200-20 MG/5ML suspension 30 mL  30 mL Oral Q4H PRN Caroleen Hamman, NP      . aspirin EC tablet 81 mg  81 mg Oral QHS Mickie D. Adams, PA   81 mg at 02/19/12 2229  . DULoxetine (CYMBALTA) DR capsule 30 mg  30 mg Oral Daily Lewanna Petrak, NP      . glimepiride (AMARYL) tablet 4 mg  4 mg Oral QAC breakfast Rachael Fee, MD   4 mg at 02/20/12 0819  . losartan (COZAAR) tablet 50 mg  50 mg Oral Daily Mickie D. Adams, PA   50 mg at 02/20/12 0820  . magnesium hydroxide (MILK OF MAGNESIA) suspension 30 mL  30 mL Oral Daily PRN Caroleen Hamman, NP      . mirtazapine (REMERON) tablet 15 mg  15 mg Oral QHS Mickie D. Adams, PA   15 mg at 02/19/12 2229  . pantoprazole (PROTONIX) EC tablet 40 mg  40 mg Oral Daily Mickie D. Adams, PA   40 mg at 02/20/12 0820  . simvastatin (ZOCOR) tablet 20 mg  20 mg Oral QHS Mickie D. Adams, PA   20 mg at 02/19/12 2229  .  traZODone (DESYREL) tablet 100 mg  100 mg Oral QHS Mickie D. Adams, PA   100 mg at 02/19/12 2229  . DISCONTD: DULoxetine (CYMBALTA) DR capsule 60 mg  60 mg Oral Daily Nehemiah Settle, MD   60 mg at 02/20/12 0818    Lab Results:  Results for orders placed during the hospital encounter of 02/17/12 (from the past 48 hour(s))  GLUCOSE, CAPILLARY     Status: Abnormal   Collection Time   02/18/12  4:41 PM      Component Value Range Comment   Glucose-Capillary 106 (*) 70 - 99 mg/dL    Comment 1 Documented in Chart      Comment 2 Notify RN     GLUCOSE, CAPILLARY     Status: Normal   Collection Time   02/19/12 11:51 AM      Component Value Range Comment   Glucose-Capillary 93  70 - 99 mg/dL   GLUCOSE, CAPILLARY     Status:  Abnormal   Collection Time   02/19/12  4:56 PM      Component Value Range Comment   Glucose-Capillary 154 (*) 70 - 99 mg/dL    Comment 1 Documented in Chart      Comment 2 Notify RN     GLUCOSE, CAPILLARY     Status: Normal   Collection Time   02/20/12 11:52 AM      Component Value Range Comment   Glucose-Capillary 99  70 - 99 mg/dL     Physical Findings: AIMS: Facial and Oral Movements Muscles of Facial Expression: None, normal Lips and Perioral Area: None, normal Jaw: None, normal Tongue: None, normal,Extremity Movements Upper (arms, wrists, hands, fingers): None, normal Lower (legs, knees, ankles, toes): None, normal, Trunk Movements Neck, shoulders, hips: None, normal, Overall Severity Severity of abnormal movements (highest score from questions above): None, normal Incapacitation due to abnormal movements: None, normal Patient's awareness of abnormal movements (rate only patient's report): No Awareness, Dental Status Current problems with teeth and/or dentures?: No Does patient usually wear dentures?: No  CIWA:  CIWA-Ar Total: 0  COWS:  COWS Total Score: 0   Treatment Plan Summary: Daily contact with patient to assess and evaluate symptoms and progress in treatment Medication management  Plan: Cymbalta decreased to 30 mg.  Will continue the current treatment and plan.   Monte Zinni 02/20/2012, 4:20 PM

## 2012-02-20 NOTE — Discharge Planning (Signed)
[name] attended AM group, good participation.  Admits to SI previous to admission, denies now.  States husband passed away a year ago, and life has been a bit of a struggle since then.  Has decided that she isolates, which led to increased depression, and now has a plan that involves going to therapy and attending community groups weekly.  Feels we have done all for her that she needs, and is hopeful about going home today.  Gave me permission to call mother today.  Plans to follow up wit Dr Evelene Croon as well.

## 2012-02-20 NOTE — Progress Notes (Signed)
[facility] Group Notes:  (Counselor/Nursing/MHT/Case Management/Adjunct)  02/20/2012 3:54 AM  Type of Therapy:  Psychoeducational Skills  Participation Level:  Active  Participation Quality:  Attentive  Affect:  Appropriate  Cognitive:  Appropriate  Insight:  Good  Engagement in Group:  Limited  Engagement in Therapy:  Limited  Modes of Intervention:  Education  Summary of Progress/Problems: The depression has rated her depression and anxiety as a 0 out of 10. Her goal for tomorrow is to get discharged.    Sebastien Jackson S 02/20/2012, 3:54 AM

## 2012-02-20 NOTE — Progress Notes (Signed)
[facility] Group Notes:  (Counselor/Nursing/MHT/Case Management/Adjunct)  02/20/2012 4:24 PM  Type of Therapy:  Group Therapy  Participation Level:  Active  Participation Quality:  Appropriate and Attentive  Affect:  Appropriate  Cognitive:  Alert and Appropriate  Insight:  Good  Engagement in Group:  Good  Engagement in Therapy:  Good  Modes of Intervention:  Problem-solving, Support and Exploration  Summary of Progress/Problems:  Lamisha participated in group.  She shared her obstacle to overcome is her weight gain which has kept her from reaching out to her church group.   Wynn Banker 02/20/2012, 4:24 PM

## 2012-02-20 NOTE — Progress Notes (Signed)
D:  On patient's self inventory sheet, patient has fair sleep, good appetite, normal energy level, good attention span.   Denied depression and hopelessness.   Denied withdrawals.  Denied SI.   Denied physical problems.  Plans to go to counseling after discharge.  Does have discharge plans.  No problems taking medications after discharge. A:  Medications given per MD order.  Support and encouragement given throughout day.  Support and safety checks completed as ordered. R:  Following treatment plan.   Denied SI and HI.  Denied A/V hallucinations.  Patient remains safe and receptive on unit.  Contracts for safety.

## 2012-02-20 NOTE — Discharge Planning (Signed)
Spoke with pt's mother for Suicide Education. In the process, she informed me that she and sons are concerned about pts use of xanax.  Because of slurring, stumbling and nodding off, sons have never allowed her to babysit grandchildren, and in fact use great grandmother for help with sitting. Speaking for family, mother believes that she should be taken off of the medication.  Explained to her that Carin Primrose may choose not to, but that I will let the Dr know so that we can talk about concerns and options.

## 2012-02-20 NOTE — Treatment Plan (Signed)
Interdisciplinary Treatment Plan Update (Adult)  Date: 02/20/2012  Time Reviewed: 1:04 PM   Progress in Treatment: Attending groups: Yes Participating in groups: Yes Taking medication as prescribed: Yes Tolerating medication: Yes   Family/Significant other contact made:   Patient understands diagnosis:  Yes  As evidenced by asking for help with mood stabilization, finding community resources Discussing patient identified problems/goals with staff:  Yes  See below Medical problems stabilized or resolved:  Yes Denies suicidal/homicidal ideation: Yes In AM group Issues/concerns per patient self-inventory:  None Other:  New problem(s) identified: N/A  Reason for Continuation of Hospitalization: Medication stabilization  Interventions implemented related to continuation of hospitalization:  Cymbalta, Remeron trial  Encourage group attendance and participation  Additional comments:  Estimated length of stay:1-2 days  Discharge Plan: see below  New goal(s): N/A  Review of initial/current patient goals per problem list:   1.  Goal(s): Eliminate SI  Met:  Yes  Target date:10/21  As evidenced EA:VWUJ report  2.  Goal (s): Stabilize mood  Met:  Yes  Target date:10/21  As evidenced by:Andra will rate her depression and anxiety at a 3 or less  3.  Goal(s): Identify comprehensive mental wellness plan  Met:  Yes  Target date:10/21  As evidenced by:Tashika states she will continue to see Dr Evelene Croon, will attend groups in the community, and will find a therapist  4.  Goal(s):  Met:  Yes  Target date:  As evidenced by:  Attendees: Patient:     Family:     Physician:  Dr Daleen Bo 02/20/2012 1:04 PM   Nursing:   Neill Loft 02/20/2012 1:04 PM   Case Manager:  Richelle Ito  02/20/2012 1:04 PM   Counselor:   02/20/2012 1:04 PM   Other:     Other:     Other:     Other:      Scribe for Treatment Team:   Ida Rogue, 02/20/2012 1:04 PM

## 2012-02-21 DIAGNOSIS — F329 Major depressive disorder, single episode, unspecified: Secondary | ICD-10-CM

## 2012-02-21 MED ORDER — TRAZODONE HCL 100 MG PO TABS: 100.0000 mg | ORAL_TABLET | Freq: Every day | ORAL | Status: DC

## 2012-02-21 MED ORDER — ALPRAZOLAM 1 MG PO TABS: 1.0000 mg | ORAL_TABLET | Freq: Four times a day (QID) | ORAL | Status: DC | PRN

## 2012-02-21 MED ORDER — ALPRAZOLAM 1 MG PO TABS: 0.5000 mg | ORAL_TABLET | Freq: Two times a day (BID) | ORAL | Status: DC | PRN

## 2012-02-21 MED ORDER — MIRTAZAPINE 15 MG PO TABS: 15.0000 mg | ORAL_TABLET | Freq: Every day | ORAL | Status: DC

## 2012-02-21 MED ORDER — BUPROPION HCL ER (XL) 150 MG PO TB24: 150.0000 mg | ORAL_TABLET | Freq: Every morning | ORAL | Status: DC

## 2012-02-21 MED ORDER — DULOXETINE HCL 30 MG PO CPEP: 30.0000 mg | ORAL_CAPSULE | Freq: Every day | ORAL | Status: DC

## 2012-02-21 NOTE — Progress Notes (Signed)
D:  Patient's self inventory sheet, patient sleeps well, has good appetite, normal energy level, good attention span.   Denied depression and hopelessness.  Denied withdrawals.  Denied SI.  Zero pain goal and zero pain.  "Taking better self-care, putting things on schedule" after discharge.  No questions for staff.  Does have discharge plans.  No problems taking meds after discharge. A:  Medications given per MD order.  Support and encouragement given throughout day.  Support and safety checks completed as ordered. R:  Following treatment plan.   Denied SI and HI.   Denied A/V hallucinations  Contracts for safety.  Patient remains safe and receptive on unit.

## 2012-02-21 NOTE — Discharge Summary (Signed)
Physician Discharge Summary Note  Patient:  [name] is an 56 y.o., female MRN:  [id number] DOB:  08-25-1955 Patient phone:  [phone] (home)  Patient address:   [address] [place] [address]   Date of Admission:  02/17/2012 Date of Discharge:   Discharge Diagnoses: Principal Problem:  *Depression with suicidal ideation  Axis Diagnosis: AXIS I: Depressive Disorder NOS  AXIS II: No diagnosis  AXIS III:  Past Medical History   Diagnosis  Date   .  Weight gain    .  Night sweats    .  Fatigue      due to loss of sleep   .  Herpes    .  Diabetes mellitus    .  GERD (gastroesophageal reflux disease)    .  Hx of blood clots    .  Contact lens/glasses fitting    .  Morbid obesity    .  Pulmonary embolism  2009   .  Fracture of right heel  2006   .  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  Depression    .  Suicidal ideation     AXIS IV: other psychosocial or environmental problems  AXIS V: 61-70 mild symptoms       Level of Care:  Inpatient Hospitalization.  Reason for admission:Has been followed for Depression by Dr.Kaur for 15-20 years. Husband of 12 years dropped dead of an MI Feb 08, 2011. She found him.  This past year has been very stressful as there was no insurance money and she had to sell her home. As the first anniversary approached she became more sensitive to interactions with her sons deciding that killing herself was the answer. She emptied her bank accounts left a note to her 90 yo mother about how to tie up "loose ends" and then went to the 8th floor of a parking garage intending to jump . Found it to intimidating and hence presented to [facility] for admission. She was sent to [facility] for medical clearance until a bed became available.  Says that the intake person said something "that clicked" and she thought about it while waiting in the ED. Denies feeling suicidal anymore and requests discharge.  Has had grief therapy with  hospice and hasn't found a therapist yet that she felt was helpful.    Hospital Course:   The patient attended treatment team meeting this am and met with treatment team members. The patient's symptoms, treatment plan and response to treatment was discussed. The patient endorsed that their symptoms have improved. The patient also stated that they felt stable for discharge.  They reported that from this hospital stay they had learned many coping skills.  In other to maintain their psychiatric stability, they will continue psychiatric care on an outpatient basis. They will follow-up as outlined below.  In addition they were instructed  to take all your medications as prescribed by their mental healthcare provider and to report any adverse effects and or reactions from your medicines to their outpatient provider promptly.  The patient is also instructed and cautioned to not engage in alcohol and or illegal drug use while on prescription medicines.  In the event of worsening symptoms the patient is instructed to call the crisis hotline, 911 and or go to the nearest ED for appropriate evaluation and treatment of symptoms.   Also while a patient in this hospital, the patient received medication management for his  psychiatric symptoms. They were ordered and received as outlined below:    Medication List     As of 02/21/2012  1:09 PM    STOP taking these medications         buPROPion 150 MG 24 hr tablet   Commonly known as: WELLBUTRIN XL      TAKE these medications      Indication    ALPRAZolam 1 MG tablet   Commonly known as: XANAX   Take 0.5 tablets (0.5 mg total) by mouth 2 (two) times daily as needed for anxiety (Take 0.5mg  po  twice daily prn for next 3 days for anxiety. Patient took 2 doses yesterday, okay with taper.). For anxiety       aspirin EC 81 MG tablet   Take 81 mg by mouth at bedtime.       DULoxetine 30 MG capsule   Commonly known as: CYMBALTA   Take 1 capsule (30 mg total) by  mouth daily. For pain, mood       glimepiride 2 MG tablet   Commonly known as: AMARYL   Take 4 mg by mouth Daily.       losartan 50 MG tablet   Commonly known as: COZAAR   Take 50 mg by mouth Daily.       mirtazapine 15 MG tablet   Commonly known as: REMERON   Take 1 tablet (15 mg total) by mouth at bedtime. For depression       omeprazole 20 MG capsule   Commonly known as: PRILOSEC   Take 20 mg by mouth Daily.       simvastatin 20 MG tablet   Commonly known as: ZOCOR   Take 20 mg by mouth at bedtime.       traZODone 100 MG tablet   Commonly known as: DESYREL   Take 1 tablet (100 mg total) by mouth at bedtime. For sleep        They were also enrolled in group counseling sessions and activities in which they participated actively.       Follow-up Information    Follow up with Dr Evelene Croon. On 02/22/2012. (9:00)    Contact information:   [address]  [address] [place] [phone]       Follow up with [facility] [place]. (Walk in at either 8 or 1, M-F for an assessmnet for a therapist.  They will either see you for no fee or a small fee.)    Contact information:   [address]    [336]  [phone]         Upon discharge, patient adamantly denies suicidal, homicidal ideations, auditory, visual hallucinations and or delusional thinking. They left [facility] with all personal belongings via personal transportation in no apparent distress.  Consults:  Please see electronic medical record for details.  Significant Diagnostic Studies:  Please see electronic medical record for details.  Discharge Vitals:   Blood pressure 128/75, pulse 102, temperature 97.8 F (36.6 C), temperature source Oral, resp. rate 18, height 5\' 7"  (1.702 m)..  Mental Status Exam: See Mental Status Examination and Suicide Risk Assessment completed by Attending Physician prior to discharge.  Discharge destination:  Home Is patient on multiple antipsychotic therapies  at discharge:  No  Has Patient had three or more failed trials of antipsychotic monotherapy by history: N/A Recommended Plan for Multiple Antipsychotic Therapies: N/A Discharge Orders    Future Orders Please Complete By Expires  Diet - low sodium heart healthy      Diet - low sodium heart healthy      Increase activity slowly      Increase activity slowly          Medication List     As of 02/21/2012  1:09 PM    STOP taking these medications         buPROPion 150 MG 24 hr tablet   Commonly known as: WELLBUTRIN XL      TAKE these medications      Indication    ALPRAZolam 1 MG tablet   Commonly known as: XANAX   Take 0.5 tablets (0.5 mg total) by mouth 2 (two) times daily as needed for anxiety (Take 0.5mg  po  twice daily prn for next 3 days for anxiety. Patient took 2 doses yesterday, okay with taper.). For anxiety       aspirin EC 81 MG tablet   Take 81 mg by mouth at bedtime.       DULoxetine 30 MG capsule   Commonly known as: CYMBALTA   Take 1 capsule (30 mg total) by mouth daily. For pain, mood       glimepiride 2 MG tablet   Commonly known as: AMARYL   Take 4 mg by mouth Daily.       losartan 50 MG tablet   Commonly known as: COZAAR   Take 50 mg by mouth Daily.       mirtazapine 15 MG tablet   Commonly known as: REMERON   Take 1 tablet (15 mg total) by mouth at bedtime. For depression       omeprazole 20 MG capsule   Commonly known as: PRILOSEC   Take 20 mg by mouth Daily.       simvastatin 20 MG tablet   Commonly known as: ZOCOR   Take 20 mg by mouth at bedtime.       traZODone 100 MG tablet   Commonly known as: DESYREL   Take 1 tablet (100 mg total) by mouth at bedtime. For sleep            Follow-up Information    Follow up with Dr Evelene Croon. On 02/22/2012. (9:00)    Contact information:   [address]  [address] [place] [phone]       Follow up with [facility] [place]. (Walk in at either 8 or 1, M-F for an assessmnet  for a therapist.  They will either see you for no fee or a small fee.)    Contact information:   [address]  [place]  [336]  [phone]        Follow-up recommendations:   Activities: Resume typical activities Diet: Resume typical diet Other: Follow up with outpatient provider and report any side effects to out patient prescriber. Patient was taking upto 6mg  of Xanax daily outpatient, was reduced to 4 times daily prn in the hospital and yesterday patient took 2 doses (2mg ) of xanax and did well. Discussed tapering furthur to 0.5mg  po bid for 3 days and working with her outpatient Psychiatrist. Patient agreeable to above plan.  Comments:  Take all your medications as prescribed by your mental healthcare provider. Report any adverse effects and or reactions from your medicines to your outpatient provider promptly. Patient is instructed and cautioned to not engage in alcohol and or illegal drug use while on prescription medicines. In the event of worsening symptoms, patient  is instructed to call the crisis hotline, 911 and or go to the nearest ED for appropriate evaluation and treatment of symptoms. Follow-up with your primary care provider for your other medical issues, concerns and or health care needs.  SignedPatrick North 02/21/2012 1:09 PM

## 2012-02-21 NOTE — Progress Notes (Signed)
[facility] Case Management Discharge Plan:  Will you be returning to the same living situation after discharge: Yes,  home At discharge, do you have transportation home?:Yes,  mother Do you have the ability to pay for your medications:Yes,  insurance  Interagency Information:     Release of information consent forms completed and in the chart;  Patient's signature needed at discharge.  Patient to Follow up at:  Follow-up Information    Follow up with Dr Evelene Croon. On 02/22/2012. (9:00)    Contact information:   [address]  [address] [place] [phone]       Follow up with [facility] [place]. (Walk in at either 8 or 1, M-F for an assessmnet for a therapist.  They will either see you for no fee or a small fee.)    Contact information:   [address]  [place]  [336]  [phone]         Patient denies SI/HI:   Yes,  yes    Safety Planning and Suicide Prevention discussed:  Yes,  yes  Barrier to discharge identified:No.  Summary and Recommendations:   [name] 02/21/2012, 9:34 AM

## 2012-02-21 NOTE — Progress Notes (Signed)
Patient discharged home.  Discharge instructions, follow up instructions, prescriptions given to patient.  Patient verbalized understanding of instructions.

## 2012-02-21 NOTE — [facility] Suicide Risk Assessment (Signed)
Suicide Risk Assessment  Discharge Assessment     Demographic Factors:  Female, caucasian  Mental Status Per Nursing Assessment::   On Admission:     Current Mental Status by Physician: Patient denies suicidal ideations. She is alert and oriented to 4. Denies AH/VH/SI/HI.  Loss Factors: Loss of significant relationship and Decline in physical health  Historical Factors: Impulsivity  Risk Reduction Factors:   Positive social support  Continued Clinical Symptoms:  Depression:   Recent sense of peace/wellbeing  Cognitive Features That Contribute To Risk:  Cognitively intact  Suicide Risk:  Minimal: No identifiable suicidal ideation.  Patients presenting with no risk factors but with morbid ruminations; may be classified as minimal risk based on the severity of the depressive symptoms  Discharge Diagnoses:   AXIS I:  Depressive Disorder NOS AXIS II:  No diagnosis AXIS III:   Past Medical History  Diagnosis Date  . Weight gain   . Night sweats   . Fatigue     due to loss of sleep  . Herpes   . Diabetes mellitus   . GERD (gastroesophageal reflux disease)   . Hx of blood clots   . Contact lens/glasses fitting   . Morbid obesity   . Pulmonary embolism 2009  . Fracture of right heel 2006  . 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 Depression   . Suicidal ideation    AXIS IV:  other psychosocial or environmental problems AXIS V:  61-70 mild symptoms  Plan Of Care/Follow-up recommendations:  Activity:  normal Diet:  normal Follow up with outpatient appointments.  Is patient on multiple antipsychotic therapies at discharge:  No   Has Patient had three or more failed trials of antipsychotic monotherapy by history:  No  Recommended Plan for Multiple Antipsychotic Therapies: NA  [name] 02/21/2012, 10:40 AM

## 2012-02-21 NOTE — Progress Notes (Signed)
D: Patient denies SI/HI/AVH. Patient rates hopelessness as 0,  depression as 0 , and anxiety as 0.  Patient affect is appropriate. Mood is appropriate and bright.  Pt states "hopefully I will be getting out tomorrow. I am so excited about sleeping in my own bed."  Patient did attend evening group. Patient visible on the milieu. No distress noted. A: Support and encouragement offered. Scheduled medications given to pt. Q 15 min checks continued for patient safety. R: Patient receptive. Patient remains safe on the unit.

## 2012-02-24 NOTE — Progress Notes (Signed)
Patient Discharge Instructions:  Scanned updates faxed 02/24/2012  Faxed to [facility] @ [phone] And to [facility] @ [phone]  [name], 02/24/2012, 2:02 PM

## 2012-02-24 NOTE — Progress Notes (Signed)
Patient Discharge Instructions:  After Visit Summary (AVS):   Faxed to:  02/24/12 Psychiatric Admission Assessment Note:   Faxed to:  02/24/12 Suicide Risk Assessment - Discharge Assessment:   Faxed to:  02/24/12 Faxed/Sent to the Next Level Care provider:  02/24/12 Faxed to Dr. Evelene Croon @ [phone] Faxed to [facility] @ [phone] Jerelene Redden, 02/24/2012, 11:47 AM

## 2012-02-29 ENCOUNTER — Telehealth: Payer: Self-pay | Admitting: Hematology and Oncology

## 2012-02-29 NOTE — Telephone Encounter (Signed)
C/D 02/29/12 for appt. 03/13/12

## 2012-03-13 ENCOUNTER — Telehealth: Payer: Self-pay | Admitting: Hematology and Oncology

## 2012-03-13 ENCOUNTER — Ambulatory Visit (HOSPITAL_BASED_OUTPATIENT_CLINIC_OR_DEPARTMENT_OTHER): Payer: Medicare Other

## 2012-03-13 ENCOUNTER — Encounter: Payer: Self-pay | Admitting: Hematology and Oncology

## 2012-03-13 ENCOUNTER — Ambulatory Visit (HOSPITAL_BASED_OUTPATIENT_CLINIC_OR_DEPARTMENT_OTHER): Payer: Medicare Other | Admitting: Hematology and Oncology

## 2012-03-13 ENCOUNTER — Other Ambulatory Visit: Payer: Medicare Other | Admitting: Lab

## 2012-03-13 ENCOUNTER — Ambulatory Visit (HOSPITAL_BASED_OUTPATIENT_CLINIC_OR_DEPARTMENT_OTHER): Payer: Medicare Other | Admitting: Lab

## 2012-03-13 ENCOUNTER — Other Ambulatory Visit: Payer: Self-pay | Admitting: Hematology and Oncology

## 2012-03-13 VITALS — BP 188/85 | HR 103 | Temp 97.6°F | Resp 20 | Ht 67.0 in | Wt 264.7 lb

## 2012-03-13 DIAGNOSIS — L408 Other psoriasis: Secondary | ICD-10-CM

## 2012-03-13 DIAGNOSIS — D72829 Elevated white blood cell count, unspecified: Secondary | ICD-10-CM

## 2012-03-13 LAB — CBC WITH DIFFERENTIAL/PLATELET
Basophils Absolute: 0.1 10*3/uL (ref 0.0–0.1)
Eosinophils Absolute: 0.2 10*3/uL (ref 0.0–0.5)
HCT: 37.1 % (ref 34.8–46.6)
HGB: 12.3 g/dL (ref 11.6–15.9)
LYMPH%: 28.9 % (ref 14.0–49.7)
MCV: 83.7 fL (ref 79.5–101.0)
MONO%: 4.1 % (ref 0.0–14.0)
NEUT#: 8.9 10*3/uL — ABNORMAL HIGH (ref 1.5–6.5)
NEUT%: 65.4 % (ref 38.4–76.8)
Platelets: 317 10*3/uL (ref 145–400)
RBC: 4.43 10*6/uL (ref 3.70–5.45)

## 2012-03-13 LAB — MORPHOLOGY
PLT EST: ADEQUATE
RBC Comments: NORMAL
White Cell Comments: NONE SEEN

## 2012-03-13 LAB — COMPREHENSIVE METABOLIC PANEL (CC13)
Alkaline Phosphatase: 146 U/L (ref 40–150)
BUN: 11 mg/dL (ref 7.0–26.0)
CO2: 27 mEq/L (ref 22–29)
Glucose: 162 mg/dl — ABNORMAL HIGH (ref 70–99)
Total Bilirubin: 0.26 mg/dL (ref 0.20–1.20)

## 2012-03-13 NOTE — Patient Instructions (Addendum)
[name]  [id number]   [facility] - AFTER VISIT SUMMARY   **RECOMMENDATIONS MADE BY THE CONSULTANT AND ANY TEST    RESULTS WILL BE SENT TO YOUR REFERRING DOCTORS.   YOUR EXAM FINDINGS, LABS AND RESULTS WERE DISCUSSED BY YOUR MD TODAY.  YOU CAN GO TO THE [place] WEB SITE FOR INSTRUCTIONS ON HOW TO ASSESS MY CHART FOR ADDITIONAL INFORMATION AS NEEDED.  Your Updated drug allergies are: Allergies as of 03/13/2012 - Review Complete 02/18/2012  Allergen Reaction Noted  . Metformin and related Diarrhea 10/07/2011  . Tegretol (carbamazepine)  12/29/2011    Your current list of medications are: Current Outpatient Prescriptions  Medication Sig Dispense Refill  . ALPRAZolam (XANAX) 1 MG tablet Take 0.5 tablets (0.5 mg total) by mouth 2 (two) times daily as needed for anxiety (Take 0.5mg  po  twice daily prn for next 3 days for anxiety. Patient took 2 doses yesterday, okay with taper.). For anxiety  30 tablet    . aspirin EC 81 MG tablet Take 81 mg by mouth at bedtime.      . DULoxetine (CYMBALTA) 30 MG capsule Take 1 capsule (30 mg total) by mouth daily. For pain, mood  30 capsule  0  . glimepiride (AMARYL) 2 MG tablet Take 4 mg by mouth Daily.       Marland Kitchen losartan (COZAAR) 50 MG tablet Take 50 mg by mouth Daily.       . mirtazapine (REMERON) 15 MG tablet Take 1 tablet (15 mg total) by mouth at bedtime. For depression  30 tablet  0  . omeprazole (PRILOSEC) 20 MG capsule Take 20 mg by mouth Daily.       . simvastatin (ZOCOR) 20 MG tablet Take 20 mg by mouth at bedtime.       . traZODone (DESYREL) 100 MG tablet Take 1 tablet (100 mg total) by mouth at bedtime. For sleep  30 tablet  0     INSTRUCTIONS GIVEN AND DISCUSSED:  See attached schedule   SPECIAL INSTRUCTIONS/FOLLOW-UP:  See above.  I acknowledge that I have been informed and understand all the instructions given to me and received a copy.I know to contact the clinic, my physician, or go to the emergency  Department if any problems should occur.   I do not have any more questions at this time, but understand that I may call the [facility] at [phone] during business hours should I have any further questions or need assistance in obtaining follow-up care.

## 2012-03-13 NOTE — Progress Notes (Signed)
Checked in new patient. No financial issues. °

## 2012-03-13 NOTE — Progress Notes (Signed)
This office note has been dictated.

## 2012-03-13 NOTE — Telephone Encounter (Signed)
appt made and printed for pt Pt sent back to the lab Pt aware that cen. Sch. Will call with u/s appt

## 2012-03-14 NOTE — Progress Notes (Signed)
CC:   [name] Little, M.D. [name], M.D.  IDENTIFYING STATEMENT:  The a 56 year old woman seen at the request of Dr. Clarene [place] with leukocytosis.  HISTORY OF PRESENT ILLNESS:  The patient relates that Dr. Clarene [place] had noted a gradual increase in the patient's white cell count over the last couple of months or so.  The patient does not believe she has an ongoing infection.  She reports that she was recently diagnosed with psoriasis By a Dermatologist involving both upper extremities and back.  Topical creams have been tried with little effect.  Plan to begin oral medication, but this is on hold until hematology workup.  She denies joint pain.  She is not on steroids.  She denies history of tobacco use. She is adopted, thus cannot report a family history.  Reviewed CBC which notes the following:  02/16/2012 white cell count 14.1, hemoglobin 12.3, hematocrit 37.6, platelets 298; 02/23/2012 white cell count 15.9, hemoglobin 12.7, hematocrit 39.2, platelets 350.  ALLERGIES:  Metformin, Augmentin, Tegretol.  PAST MEDICAL HISTORY: 1. Type 2 diabetes. 2. Hypertension. 3. Psoriasis. 4. History of sleep disturbance. 5. Mitral valve prolapse. 6. GERD. 7. Mixed dyslipidemia. 8. Status post partial hysterectomy in 2003. 9. History of PE diagnosed in 2009. 10.History of depression with suicidal ideation.  SOCIAL HISTORY:  She is adopted.  Denies alcohol or tobacco use.  She is widowed.  Has 2 sons, ages 11 and 53.  She is disabled from past heel injury and works with Architectural technologist at a firm.  FAMILY HISTORY:  Adopted.  REVIEW OF SYSTEMS:  Denies fever, chills, night sweats, anorexia, weight loss.  Cardiovascular:  Denies chest pain, PND, orthopnea, ankle swelling.  Respiratory:  Denies cough, hemoptysis, wheeze, shortness of breath.  Skin:  Notes psoriasis.  Musculoskeletal:  Denies joint aches, muscle pains.  Neurologic:  Denies headache, vision change,  extremity weakness.  Rest of review of systems negative.  PHYSICAL EXAM:  General:  Patient is alert and oriented x3.  Vitals: Pulse 103, blood pressure 188/85, temperature 97.6, respirations 20, weight 264 pounds.  HEENT:  Head is atraumatic, normocephalic. Extraocular muscles intact.  Sclerae anicteric.  Mouth no thrush.  Neck: Supple.  Chest:  Clear to both percussion and auscultation. Cardiovascular:  First and second heart sounds present.  No added sounds or murmurs.  Abdomen:  Soft.  No hepatomegaly.  Bowel sounds present. Extremities:  No edema or calf tenderness.  Pulses present and symmetrical.  Lymph nodes:  No palpable cervical, axillary, inguinal adenopathy.  CNS:  Nonfocal.  Skin:  Notes psoriatic plaque lesions on both upper extremities and back.  Also lesions on scalp.  IMPRESSION AND PLAN:  Mrs. Yzaguirre is a pleasant 56 year old woman who was noted to have leukocytosis.  She also has underlying psoriasis. She is  a nonsmoker.  Has no signs infection.  I think at least we need to rule out a myeloproliferative disorder, thus I will suggest we obtain peripheral blood for JAK2 mutation and BCR-ABL.  We will repeat a CBC with differential.  We will review morphology.  We will obtain a leukocyte alkaline phosphatase.  We will obtain a serum protein electrophoresis with reflex IFE.  We will also obtain an ultrasound to evaluate liver and spleen.  I spent more than half the time coordinating care.  The patient presents to discuss results.    ______________________________ Laurice Record, M.D. LIO/MEDQ  D:  03/13/2012  T:  03/14/2012  Job:  [id number]

## 2012-03-19 ENCOUNTER — Ambulatory Visit (HOSPITAL_COMMUNITY)
Admission: RE | Admit: 2012-03-19 | Discharge: 2012-03-19 | Disposition: A | Discharge status: Home / Self Care | Payer: Medicare Other | Source: Ambulatory Visit | Attending: Hematology and Oncology | Admitting: Hematology and Oncology

## 2012-03-19 DIAGNOSIS — D72829 Elevated white blood cell count, unspecified: Secondary | ICD-10-CM | POA: Insufficient documentation

## 2012-03-22 ENCOUNTER — Other Ambulatory Visit: Payer: Medicare Other | Admitting: Lab

## 2012-03-22 ENCOUNTER — Telehealth: Payer: Self-pay | Admitting: Hematology and Oncology

## 2012-03-22 ENCOUNTER — Encounter: Payer: Self-pay | Admitting: Hematology and Oncology

## 2012-03-22 ENCOUNTER — Ambulatory Visit (HOSPITAL_BASED_OUTPATIENT_CLINIC_OR_DEPARTMENT_OTHER): Payer: Medicare Other | Admitting: Hematology and Oncology

## 2012-03-22 VITALS — BP 150/71 | HR 85 | Temp 97.5°F | Resp 20 | Ht 67.0 in | Wt 265.6 lb

## 2012-03-22 DIAGNOSIS — D72829 Elevated white blood cell count, unspecified: Secondary | ICD-10-CM

## 2012-03-22 DIAGNOSIS — L408 Other psoriasis: Secondary | ICD-10-CM

## 2012-03-22 LAB — SPEP & IFE WITH QIG
Beta 2: 8.2 % — ABNORMAL HIGH (ref 3.2–6.5)
Beta Globulin: 7.9 % — ABNORMAL HIGH (ref 4.7–7.2)
IgA: 642 mg/dL — ABNORMAL HIGH (ref 69–380)
IgG (Immunoglobin G), Serum: 1390 mg/dL (ref 690–1700)
IgM, Serum: 62 mg/dL (ref 52–322)
Total Protein, Serum Electrophoresis: 7.4 g/dL (ref 6.0–8.3)

## 2012-03-22 NOTE — Telephone Encounter (Signed)
S/w pt re appt for 12/05/2012.

## 2012-03-22 NOTE — Progress Notes (Signed)
CC:   [name] Little, M.D. [name], M.D.  IDENTIFYING STATEMENT:  The patient is a 56 year old woman who presents to discuss results.  INTERVAL HISTORY:  In summary, the patient was referred for leukocytosis.  Her white blood cells have increased over a couple of months.  She was recently diagnosed with psoriasis and topical therapy has had little effect.  She otherwise  has no signs of infection.  She has no family history as she is adopted.    We reviewed her results, which note the following:  03/13/2012 white cell count 13.6, hemoglobin 12.3, hematocrit 37.1, platelets 217; peripheral smear was essentially unremarkable with no immature neutrophils noted; serum protein electrophoresis did not detect an M spike, and IgG, IgA and IgM levels were unremarkable and were 1390, 642, 62, respectively.  A sonogram of the abdomen showed a normal sized liver with very mild hepatic steatosis.  Peripheral blood for JAK2 mutation analysis was negative.  In addition, BCR-ABL was not detected.  MEDICATIONS:  Reviewed and updated.  ALLERGIES:  Metformin, Augmentin, Tegretol.  PAST MEDICAL HISTORY/FAMILY HISTORY/SOCIAL HISTORY:  Unchanged.  REVIEW OF SYSTEMS:  10-point review of systems negative.  PHYSICAL EXAM:  General:  The patient is a well-appearing, well- nourished woman in no distress.  Vitals:  Pulse 85, blood pressure 150/71, temperature 97.5, respirations 20, weight 255 pounds.  HEENT: Head is atraumatic, normocephalic.  Sclerae anicteric.  Mouth moist. Chest:  Clear.  CVS:  Unremarkable.  Abdomen:  Soft, nontender.  No masses.  Bowel sounds present.  Extremities:  No calf tenderness.  Skin: Psoriatic-type plaques on upper extremities and back and chest wall.  LABORATORY DATA:  As above.  In addition, sodium 141, potassium 3.7, chloride 107, CO2 27, BUN 11, creatinine 1, glucose 162.  T bili 0.26, alkaline phosphatase 146, AST 20, ALT 18.  IMPRESSION AND PLAN:  Mrs.  [name] is a 56 year old woman with mild leukocytosis, felt to be reactive to underlying psoriasis.  Workup thus far does not indicate any signs of underlying neoplasia.  So, at this point in time, it my recommendation that we monitor for now.  I would like for her to return in a couple of months' time with a CBC.  I have informed her that she can go ahead and get her psoriasis treated.    ______________________________ Laurice Record, M.D. LIO/MEDQ  D:  03/22/2012  T:  03/22/2012  Job:  [id number]

## 2012-03-22 NOTE — Progress Notes (Signed)
This office note has been dictated.

## 2012-03-22 NOTE — Patient Instructions (Signed)
[name]  [id number]   [facility] - AFTER VISIT SUMMARY   **RECOMMENDATIONS MADE BY THE CONSULTANT AND ANY TEST    RESULTS WILL BE SENT TO YOUR REFERRING DOCTORS.   YOUR EXAM FINDINGS, LABS AND RESULTS WERE DISCUSSED BY YOUR MD TODAY.  YOU CAN GO TO THE [facility] WEB SITE FOR INSTRUCTIONS ON HOW TO ASSESS MY CHART FOR ADDITIONAL INFORMATION AS NEEDED.  Your Updated drug allergies are: Allergies as of 03/22/2012 - Review Complete 03/22/2012  Allergen Reaction Noted  . Metformin and related Diarrhea 10/07/2011  . Tegretol (carbamazepine)  12/29/2011    Your current list of medications are: Current Outpatient Prescriptions  Medication Sig Dispense Refill  . ALPRAZolam (XANAX) 1 MG tablet Take 0.5 tablets (0.5 mg total) by mouth 2 (two) times daily as needed for anxiety (Take 0.5mg  po  twice daily prn for next 3 days for anxiety. Patient took 2 doses yesterday, okay with taper.). For anxiety  30 tablet    . aspirin EC 81 MG tablet Take 81 mg by mouth at bedtime.      . bisacodyl (DULCOLAX) 5 MG EC tablet Take 5 mg by mouth daily as needed.      Marland Kitchen buPROPion (WELLBUTRIN XL) 150 MG 24 hr tablet Take 150 mg by mouth daily.       . DULoxetine (CYMBALTA) 30 MG capsule Take 1 capsule (30 mg total) by mouth daily. For pain, mood  30 capsule  0  . glimepiride (AMARYL) 2 MG tablet Take 4 mg by mouth Daily.       Marland Kitchen losartan (COZAAR) 50 MG tablet Take 50 mg by mouth Daily.       . mirtazapine (REMERON) 15 MG tablet Take 1 tablet (15 mg total) by mouth at bedtime. For depression  30 tablet  0  . Multiple Vitamin (MULTIVITAMIN) tablet Take 1 tablet by mouth daily.      Marland Kitchen omeprazole (PRILOSEC) 20 MG capsule Take 20 mg by mouth Daily.       . simvastatin (ZOCOR) 20 MG tablet Take 20 mg by mouth at bedtime.       . traZODone (DESYREL) 100 MG tablet Take 1 tablet (100 mg total) by mouth at bedtime. For sleep  30 tablet  0     INSTRUCTIONS GIVEN AND DISCUSSED:  See attached  schedule   SPECIAL INSTRUCTIONS/FOLLOW-UP:  See above.  I acknowledge that I have been informed and understand all the instructions given to me and received a copy.I know to contact the clinic, my physician, or go to the emergency Department if any problems should occur.   I do not have any more questions at this time, but understand that I may call the [facility] at [phone] during business hours should I have any further questions or need assistance in obtaining follow-up care.

## 2012-03-27 ENCOUNTER — Telehealth (INDEPENDENT_AMBULATORY_CARE_PROVIDER_SITE_OTHER): Payer: Self-pay

## 2012-03-27 ENCOUNTER — Encounter (INDEPENDENT_AMBULATORY_CARE_PROVIDER_SITE_OTHER): Payer: Self-pay

## 2012-03-27 NOTE — Telephone Encounter (Signed)
Pre-Operative Medical/Psychiatric Clearance faxed to Dr. Evelene Croon @ [phone]

## 2012-04-13 ENCOUNTER — Other Ambulatory Visit (INDEPENDENT_AMBULATORY_CARE_PROVIDER_SITE_OTHER): Payer: Self-pay | Admitting: General Surgery

## 2012-04-13 DIAGNOSIS — E669 Obesity, unspecified: Secondary | ICD-10-CM

## 2012-04-19 ENCOUNTER — Encounter: Payer: Medicare Other | Attending: General Surgery | Admitting: *Deleted

## 2012-04-19 ENCOUNTER — Encounter: Payer: Self-pay | Admitting: *Deleted

## 2012-04-19 DIAGNOSIS — Z713 Dietary counseling and surveillance: Secondary | ICD-10-CM | POA: Insufficient documentation

## 2012-04-19 DIAGNOSIS — Z01818 Encounter for other preprocedural examination: Secondary | ICD-10-CM | POA: Insufficient documentation

## 2012-04-19 NOTE — Progress Notes (Signed)
Bariatric Class:  Appt start time: 0830 end time:  0930.  Pre-Operative Nutrition Class  Patient was seen on 04/19/2012 for Pre-Operative Bariatric Surgery Education [facility].   Surgery date: 05/14/12 Surgery type: Sleeve Start weight at [facility]: 261.4 lbs (12/29/11)  Weight today: 266.8 lbs BMI: 41.8  Samples given per MNT protocol: Bariatric Advantage Multivitamin Lot # [id number] Exp: 06/15  Bariatric Advantage Calcium Citrate Lot # [id number] Exp:12/13  Celebrate Vitamins Multivitamin Complete Lot # [id number] Exp: 06/15  Celebrate Vitamins Multivitamin Lot # [id number] Exp: 01/15  Celebrate Vitamins Iron + C (30 mg) Lot # [id number] Exp: 09/15  Celebrate Vitamins Sublingual B12 (500 mcg) Lot # [id number] Exp: 05/15  Celebrate Vitamins Calcium Citrate Lot # [id number] Exp: 03/15  Opurity Bypass/Sleeve Optimized MVI Lot # [id number] Exp: 06/14  Corliss Marcus Protein Powder Lot # [id number] Exp: 03/15  Premier Protein Shake Lot # [id number] Exp: 02/17/13  The following the learning objective met by the patient during this course:  Identifies Pre-Op Dietary Goals and will begin 2 weeks pre-operatively  Identifies appropriate sources of fluids and proteins   States protein recommendations and appropriate sources pre and post-operatively  Identifies Post-Operative Dietary Goals and will follow for 2 weeks post-operatively  Identifies appropriate multivitamin and calcium sources  Describes the need for physical activity post-operatively and will follow MD recommendations  States when to call healthcare provider regarding medication questions or post-operative complications  Handouts given during class include:  Pre-Op Bariatric Surgery Diet Handout  Protein Shake Handout  Post-Op Bariatric Surgery Nutrition Handout  BELT Program Information Flyer  Support Group Information Flyer  [facility] Bariatric Supplements Price List  Follow-Up  Plan: Patient will follow-up at [facility] 2 weeks post operatively for diet advancement per MD.

## 2012-04-22 NOTE — Patient Instructions (Signed)
Follow:   Pre-Op Diet per MD 2 weeks prior to surgery  Phase 2- Liquids (clear/full) 2 weeks after surgery  Vitamin/Mineral/Calcium guidelines for purchasing bariatric supplements  Exercise guidelines pre and post-op per MD  Follow-up at [facility] in 2 weeks post-op for diet advancement. Contact Majesti Gambrell as needed with questions/concerns. 

## 2012-04-27 ENCOUNTER — Ambulatory Visit (INDEPENDENT_AMBULATORY_CARE_PROVIDER_SITE_OTHER): Payer: Medicare Other | Admitting: General Surgery

## 2012-04-27 ENCOUNTER — Encounter (INDEPENDENT_AMBULATORY_CARE_PROVIDER_SITE_OTHER): Payer: Self-pay | Admitting: General Surgery

## 2012-04-27 DIAGNOSIS — E785 Hyperlipidemia, unspecified: Secondary | ICD-10-CM

## 2012-04-27 DIAGNOSIS — I10 Essential (primary) hypertension: Secondary | ICD-10-CM

## 2012-04-27 DIAGNOSIS — Z6841 Body Mass Index (BMI) 40.0 and over, adult: Secondary | ICD-10-CM

## 2012-04-27 DIAGNOSIS — E119 Type 2 diabetes mellitus without complications: Secondary | ICD-10-CM

## 2012-04-27 NOTE — Progress Notes (Signed)
Patient ID: [name], female   DOB: 11-02-1955, 56 y.o.   MRN: [id number]  Chief Complaint  Patient presents with  . Bariatric Pre-op    Sleeve    HPI [name] is a 55 y.o. female.   HPIthis patient presents for her preoperative visit for vertical sleeve gastrectomy. She has a BMI of 41 with obesity related comorbidities of diabetes, hypertension, hyperlipidemia, reflux, depression, anxiety, and pseudotumor cerebri. She has been cleared by her solid psychologist despite a recent admission for suicidal thoughts. She says that this was on the one-year anniversary of the death of her husband and she just wanted to talk with someone that she was admitted. She has been cleared by her psychologist. She also has been cleared by the nutritionist. She does have a history of reflux which is well controlled with omeprazole daily.  Past Medical History  Diagnosis Date  . Weight gain   . Night sweats   . Fatigue     due to loss of sleep  . Herpes   . Diabetes mellitus   . GERD (gastroesophageal reflux disease)   . Hx of blood clots   . Contact lens/glasses fitting   . Morbid obesity   . Pulmonary embolism 2009  . Fracture of right heel 2006  . 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 Depression   . Suicidal ideation     Past Surgical History  Procedure Date  . Eye surgery 03/2001    lasik  . Hysteroscopy 2002  . Laparoscopy 2002  . Abdominal hysterectomy 2003  . Pseudotumor cerebri   . Breath tek h pylori 01/09/2012    Procedure: BREATH TEK H PYLORI;  Surgeon: Kandis Cocking, MD;  Location: [facility];  Service: General;  Laterality: N/A;    Family History  Problem Relation Age of Onset  . Adopted: Yes    Social History History  Substance Use Topics  . Smoking status: Never Smoker   . Smokeless tobacco: Never Used  . Alcohol Use: No    Allergies  Allergen Reactions  . Metformin And Related Diarrhea  . Tegretol (Carbamazepine)      Current Outpatient Prescriptions  Medication Sig Dispense Refill  . ALPRAZolam (XANAX) 1 MG tablet Take 0.5 tablets (0.5 mg total) by mouth 2 (two) times daily as needed for anxiety (Take 0.5mg  po  twice daily prn for next 3 days for anxiety. Patient took 2 doses yesterday, okay with taper.). For anxiety  30 tablet    . aspirin EC 81 MG tablet Take 81 mg by mouth at bedtime.      . bisacodyl (DULCOLAX) 5 MG EC tablet Take 5 mg by mouth daily as needed.      Marland Kitchen buPROPion (WELLBUTRIN XL) 150 MG 24 hr tablet Take 150 mg by mouth daily.       . DULoxetine (CYMBALTA) 30 MG capsule Take 1 capsule (30 mg total) by mouth daily. For pain, mood  30 capsule  0  . glimepiride (AMARYL) 2 MG tablet Take 2 mg by mouth Daily.       Marland Kitchen losartan (COZAAR) 50 MG tablet Take 50 mg by mouth Daily.       . mirtazapine (REMERON) 15 MG tablet Take 1 tablet (15 mg total) by mouth at bedtime. For depression  30 tablet  0  . Multiple Vitamin (MULTIVITAMIN) tablet Take 1 tablet by mouth daily.      Marland Kitchen omeprazole (PRILOSEC) 20  MG capsule Take 20 mg by mouth Daily.       . simvastatin (ZOCOR) 20 MG tablet Take 20 mg by mouth at bedtime.       . traZODone (DESYREL) 100 MG tablet Take 1 tablet (100 mg total) by mouth at bedtime. For sleep  30 tablet  0    Review of Systems Review of Systems All other review of systems negative or noncontributory except as stated in the HPI  Blood pressure 126/80, pulse 110, temperature 98 F (36.7 C), resp. rate 18, height 5\' 7"  (1.702 m), weight 266 lb 3.2 oz (120.748 kg).  Physical Exam Physical Exam Physical Exam  Nursing note and vitals reviewed. Constitutional: She is oriented to person, place, and time. She appears well-developed and well-nourished. No distress.  HENT:  Head: Normocephalic and atraumatic.  Mouth/Throat: No oropharyngeal exudate.  Eyes: Conjunctivae and EOM are normal. Pupils are equal, round, and reactive to light. Right eye exhibits no discharge. Left eye  exhibits no discharge. No scleral icterus.  Neck: Normal range of motion. Neck supple. No tracheal deviation present.  Cardiovascular: Normal rate, regular rhythm, normal heart sounds and intact distal pulses.   Pulmonary/Chest: Effort normal and breath sounds normal. No stridor. No respiratory distress. She has no wheezes.  Abdominal: Soft. Bowel sounds are normal. She exhibits no distension and no mass. There is no tenderness. There is no rebound and no guarding.  Musculoskeletal: Normal range of motion. She exhibits no edema and no tenderness.  Neurological: She is alert and oriented to person, place, and time.  Skin: Skin is warm and dry. Several psoriatic patches. She is not diaphoretic. No erythema. No pallor.  Psychiatric: She has a normal mood and affect. Her behavior is normal. Judgment and thought content normal.    Data Reviewed   Assessment    Morbid obesity with a BMI of 41, diabetes, hypertension, hyponatremia, reflux, depression, anxiety, pseudotumor cerebra She is ready for sleep gastrectomy. We again discussed the options for medical and surgical weight loss and the pros and cons of each and the risks and benefits. The risks of infection, bleeding, pain, scarring, weight regain, too little or too much weight loss, vitamin deficiencies and need for lifelong vitamin supplementation, hair loss, need for protein supplementation, leaks, stricture, reflux, food intolerance, need for reoperation and conversion to roux Y gastric bypass, need for open surgery, injury to spleen or surrounding structures, DVT's, PE, and death again discussed with the patient and the patient expressed understanding and desires to proceed with laparoscopic vertical sleeve gastrectomy, possible open, intraoperative endoscopy.  We again discussed the risk of increasing reflux and the need for conversion to Roux-en-Y gastric bypass. We are planning for vertical sleeve gastrectomy     Plan    The plan for  surgery coming up in January       Lodema Pilot DAVID 04/27/2012, 9:56 AM

## 2012-04-30 ENCOUNTER — Telehealth: Payer: Self-pay | Admitting: *Deleted

## 2012-04-30 NOTE — Telephone Encounter (Signed)
Returned pt call regarding multiple supplements she was sold by employee at [employer].  All were unnecessary; was sold calcium carbonate instead of citrate.  Discussed various options for them and protein shakes.

## 2012-05-04 ENCOUNTER — Encounter (HOSPITAL_COMMUNITY): Payer: Self-pay | Admitting: Pharmacy Technician

## 2012-05-07 ENCOUNTER — Encounter (HOSPITAL_COMMUNITY)
Admission: RE | Admit: 2012-05-07 | Discharge: 2012-05-07 | Disposition: A | Discharge status: Home / Self Care | Payer: Medicare Other | Source: Ambulatory Visit | Attending: General Surgery | Admitting: General Surgery

## 2012-05-07 ENCOUNTER — Encounter (HOSPITAL_COMMUNITY): Payer: Self-pay

## 2012-05-07 VITALS — BP 144/77 | HR 86 | Temp 98.0°F | Resp 16 | Ht 67.0 in | Wt 260.5 lb

## 2012-05-07 DIAGNOSIS — E669 Obesity, unspecified: Secondary | ICD-10-CM

## 2012-05-07 DIAGNOSIS — Z01812 Encounter for preprocedural laboratory examination: Secondary | ICD-10-CM | POA: Insufficient documentation

## 2012-05-07 HISTORY — DX: Myoneural disorder, unspecified: G70.9

## 2012-05-07 HISTORY — DX: Anemia, unspecified: D64.9

## 2012-05-07 HISTORY — DX: Other specified postprocedural states: Z98.890

## 2012-05-07 HISTORY — DX: Nausea with vomiting, unspecified: R11.2

## 2012-05-07 HISTORY — DX: Nonrheumatic mitral (valve) prolapse: I34.1

## 2012-05-07 LAB — CBC WITH DIFFERENTIAL/PLATELET
Eosinophils Absolute: 0.2 10*3/uL (ref 0.0–0.7)
Eosinophils Relative: 1 % (ref 0–5)
Hemoglobin: 12.8 g/dL (ref 12.0–15.0)
Lymphs Abs: 4 10*3/uL (ref 0.7–4.0)
MCH: 27.3 pg (ref 26.0–34.0)
MCV: 84.6 fL (ref 78.0–100.0)
Monocytes Relative: 5 % (ref 3–12)
RBC: 4.69 MIL/uL (ref 3.87–5.11)

## 2012-05-07 LAB — COMPREHENSIVE METABOLIC PANEL
Alkaline Phosphatase: 134 U/L — ABNORMAL HIGH (ref 39–117)
BUN: 7 mg/dL (ref 6–23)
Calcium: 9.3 mg/dL (ref 8.4–10.5)
GFR calc Af Amer: 78 mL/min — ABNORMAL LOW (ref >90)
Glucose, Bld: 95 mg/dL (ref 70–99)
Total Protein: 8.6 g/dL — ABNORMAL HIGH (ref 6.0–8.3)

## 2012-05-07 LAB — SURGICAL PCR SCREEN: MRSA, PCR: NEGATIVE

## 2012-05-07 NOTE — Patient Instructions (Signed)
20 [name]  05/07/2012   Your procedure is scheduled on: 05/14/12   Report to [facility] at 0515 AM.  Call this number if you have problems the morning of surgery: [phone]   Remember:   Do not eat food:After Midnight.  May have clear liquids:until Midnight .    Take these medicines the morning of surgery with A SIP OF WATER:    Do not wear jewelry, make-up or nail polish.  Do not wear lotions, powders, or perfumes. .  Do not shave 48 hours prior to surgery. .  Do not bring valuables to the hospital.  Contacts, dentures or bridgework may not be worn into surgery.  Leave suitcase in the car. After surgery it may be brought to your room.  For patients admitted to the hospital, checkout time is 11:00 AM the day of discharge.              SEE CHG INSTRUCTION SHEET    Please read over the following fact sheets that you were given: MRSA Information, coughing and deep breathing exercises, leg exercises              Failure to comply with these instructions may result in cancellation of your surgery.                Patient Signature __________________________________________             Nurse Signature ___________________________________________

## 2012-05-07 NOTE — Progress Notes (Signed)
Patient states she requires Zofran and " patch behind ears" for nausea and vomiting with surgery.

## 2012-05-14 NOTE — Progress Notes (Signed)
10-07-11 chest 1 view with abd epic 10-07-11 ekg epic

## 2012-05-29 ENCOUNTER — Ambulatory Visit (INDEPENDENT_AMBULATORY_CARE_PROVIDER_SITE_OTHER): Payer: Medicare Other | Admitting: General Surgery

## 2012-05-29 ENCOUNTER — Ambulatory Visit: Payer: Self-pay

## 2012-06-05 ENCOUNTER — Encounter (HOSPITAL_COMMUNITY): Admission: RE | Payer: Self-pay | Source: Ambulatory Visit

## 2012-06-05 ENCOUNTER — Inpatient Hospital Stay (HOSPITAL_COMMUNITY): Admission: RE | Admit: 2012-06-05 | Payer: Medicare Other | Source: Ambulatory Visit | Admitting: General Surgery

## 2012-06-05 SURGERY — GASTRECTOMY, SLEEVE, LAPAROSCOPIC
Anesthesia: General

## 2012-06-22 ENCOUNTER — Encounter: Payer: Self-pay | Admitting: Gastroenterology

## 2012-06-22 ENCOUNTER — Ambulatory Visit (INDEPENDENT_AMBULATORY_CARE_PROVIDER_SITE_OTHER): Payer: Medicare Other | Admitting: General Surgery

## 2012-06-23 ENCOUNTER — Telehealth: Payer: Self-pay | Admitting: Oncology

## 2012-06-23 ENCOUNTER — Encounter: Payer: Self-pay | Admitting: Oncology

## 2012-06-23 NOTE — Telephone Encounter (Signed)
S/W THE PT AND SHE IS AWARE OF THE REASSIGNING OF DR ODOGWU TO DR HA AND THAT SHE WILL BE RECEIVING AN APPT CALENDAR IN THE MAIL.

## 2012-07-27 ENCOUNTER — Emergency Department (HOSPITAL_COMMUNITY)
Admission: EM | Admit: 2012-07-27 | Discharge: 2012-07-27 | Disposition: A | Discharge status: Home / Self Care | Payer: Medicare Other | Source: Home / Self Care | Attending: Family Medicine | Admitting: Family Medicine

## 2012-07-27 ENCOUNTER — Emergency Department (INDEPENDENT_AMBULATORY_CARE_PROVIDER_SITE_OTHER): Payer: Medicare Other

## 2012-07-27 ENCOUNTER — Encounter (HOSPITAL_COMMUNITY): Payer: Self-pay | Admitting: Emergency Medicine

## 2012-07-27 DIAGNOSIS — L02611 Cutaneous abscess of right foot: Secondary | ICD-10-CM

## 2012-07-27 DIAGNOSIS — L02619 Cutaneous abscess of unspecified foot: Secondary | ICD-10-CM

## 2012-07-27 DIAGNOSIS — L03039 Cellulitis of unspecified toe: Secondary | ICD-10-CM

## 2012-07-27 MED ORDER — CEPHALEXIN 500 MG PO CAPS: 500.0000 mg | ORAL_CAPSULE | Freq: Three times a day (TID) | ORAL | Status: DC

## 2012-07-27 NOTE — ED Notes (Signed)
Pt c/o right big toe pain since yesterday. ? Spider bite. No injury. Is swollen and red. Hurts to touch. Has recurent numbness. Patient is alert and oriented. No signs of distress.

## 2012-07-27 NOTE — ED Provider Notes (Signed)
History     CSN: [id number]  Arrival date & time 07/27/12  1550   First MD Initiated Contact with Patient 07/27/12 1605      Chief Complaint  Patient presents with  . Toe Pain    (Consider location/radiation/quality/duration/timing/severity/associated sxs/prior treatment) Patient is a 57 y.o. female presenting with toe pain. The history is provided by the patient.  Toe Pain This is a new problem. The current episode started yesterday. The problem has been gradually worsening. Associated symptoms comments: Tender sts to distal right gt toe.. The symptoms are aggravated by walking. Nothing relieves the symptoms.    Past Medical History  Diagnosis Date  . Weight gain   . Night sweats   . Fatigue     due to loss of sleep  . Herpes   . Diabetes mellitus   . GERD (gastroesophageal reflux disease)   . Hx of blood clots   . Contact lens/glasses fitting   . Morbid obesity   . Pulmonary embolism 2009  . Fracture of right heel 2006  . 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 Depression   . Suicidal ideation   . PONV (postoperative nausea and vomiting)   . Mitral valve prolapse     asymptomatic   . 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 Anemia     hx of     Past Surgical History  Procedure Laterality Date  . Eye surgery  03/2001    lasik  . Hysteroscopy  2002  . Laparoscopy  2002  . Abdominal hysterectomy  2003  . Pseudotumor cerebri    . Breath tek h pylori  01/09/2012    Procedure: BREATH TEK H PYLORI;  Surgeon: Kandis Cocking, MD;  Location: [facility];  Service: General;  Laterality: N/A;  . Right heel fracture       surgery due to trauma- 2006     Family History  Problem Relation Age of Onset  . Adopted: Yes    History  Substance Use Topics  . Smoking status: Never Smoker   . Smokeless tobacco: Never Used  . Alcohol Use: No    OB History   Grav Para Term Preterm Abortions TAB SAB Ect Mult Living                   Review of Systems  Constitutional: Negative.   Musculoskeletal: Positive for joint swelling and gait problem. Negative for back pain.  Skin: Negative.     Allergies  Augmentin; Metformin and related; and Tegretol  Home Medications   Current Outpatient Rx  Name  Route  Sig  Dispense  Refill  . ALPRAZolam (XANAX) 1 MG tablet   Oral   Take 1 mg by mouth every 6 (six) hours as needed. For anxiety.         Marland Kitchen aspirin EC 81 MG tablet   Oral   Take 81 mg by mouth at bedtime.         Marland Kitchen buPROPion (WELLBUTRIN SR) 100 MG 12 hr tablet   Oral   Take 100 mg by mouth 3 (three) times daily.         . DULoxetine (CYMBALTA) 60 MG capsule   Oral   Take 60 mg by mouth every morning.          Marland Kitchen losartan (COZAAR) 50 MG tablet   Oral   Take 50 mg by mouth every morning.          Marland Kitchen  Multiple Vitamin (MULTIVITAMIN WITH MINERALS) TABS   Oral   Take 1 tablet by mouth every morning.         Marland Kitchen omeprazole (PRILOSEC) 20 MG capsule   Oral   Take 20 mg by mouth every morning.          . senna-docusate (STOOL SOFTENER & LAXATIVE) 8.6-50 MG per tablet   Oral   Take 4-5 tablets by mouth at bedtime.         . simvastatin (ZOCOR) 20 MG tablet   Oral   Take 20 mg by mouth at bedtime.          . traZODone (DESYREL) 100 MG tablet   Oral   Take 100-300 mg by mouth at bedtime.         Marland Kitchen acetaminophen (TYLENOL) 500 MG tablet   Oral   Take 1,000 mg by mouth every 6 (six) hours as needed. For pain.         . cephALEXin (KEFLEX) 500 MG capsule   Oral   Take 1 capsule (500 mg total) by mouth 3 (three) times daily. Take all of medicine and drink lots of fluids   21 capsule   0   . fluticasone (FLONASE) 50 MCG/ACT nasal spray   Nasal   Place 2 sprays into the nose daily.         Marland Kitchen gabapentin (NEURONTIN) 800 MG tablet   Oral   Take 800 mg by mouth 3 (three) times daily as needed. For neuropathy pain in feet.         Marland Kitchen glimepiride (AMARYL) 2 MG tablet   Oral   Take  2 mg by mouth daily after breakfast.          . mirtazapine (REMERON) 15 MG tablet   Oral   Take 15 mg by mouth at bedtime.           BP 174/82  Pulse 111  Temp(Src) 98.5 F (36.9 C) (Oral)  SpO2 88%  Physical Exam  Nursing note and vitals reviewed. Constitutional: She is oriented to person, place, and time. She appears well-developed and well-nourished.  Musculoskeletal: She exhibits tenderness.       Feet:  Neurological: She is alert and oriented to person, place, and time.  Skin: Skin is warm and dry. There is erythema.    ED Course  Procedures (including critical care time)  Labs Reviewed - No data to display Dg Toe Great Right  07/27/2012  *RADIOLOGY REPORT*  Clinical Data: Pain, swelling, and discoloration right great toe, history diabetes  RIGHT GREAT TOE  Comparison: Right foot radiographs 02/03/2005  Findings: Osseous mineralization normal technique. Joint spaces preserved. No acute fracture, dislocation or bone destruction.  IMPRESSION: No acute osseous abnormalities.   Original Report Authenticated By: Ulyses Southward, M.D.      1. Cellulitis and abscess of toe, right       MDM          Linna Hoff, MD 07/27/12 [phone]

## 2012-09-27 ENCOUNTER — Ambulatory Visit: Payer: Self-pay | Admitting: Family Medicine

## 2012-09-28 ENCOUNTER — Telehealth: Payer: Self-pay | Admitting: Oncology

## 2012-09-28 NOTE — Telephone Encounter (Signed)
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 S/w pt today re provider change and confirmed appt for 8/7 @ 1:30pm lb/FS.

## 2012-10-12 ENCOUNTER — Telehealth: Payer: Self-pay | Admitting: *Deleted

## 2012-10-12 NOTE — Telephone Encounter (Signed)
Lm informing the pt that on 12/06/12 we need her to come in at @:30pm for labs and ov@3pm . i made the pt aware that i will mail a letter/avs as well...td

## 2012-11-08 ENCOUNTER — Ambulatory Visit: Payer: Self-pay | Admitting: Gynecology

## 2012-11-19 DIAGNOSIS — E119 Type 2 diabetes mellitus without complications: Secondary | ICD-10-CM | POA: Insufficient documentation

## 2012-12-05 ENCOUNTER — Ambulatory Visit: Payer: Self-pay | Admitting: Hematology and Oncology

## 2012-12-05 ENCOUNTER — Other Ambulatory Visit: Payer: Self-pay | Admitting: Lab

## 2012-12-06 ENCOUNTER — Ambulatory Visit (HOSPITAL_BASED_OUTPATIENT_CLINIC_OR_DEPARTMENT_OTHER): Payer: Medicare Other | Admitting: Oncology

## 2012-12-06 ENCOUNTER — Other Ambulatory Visit (HOSPITAL_BASED_OUTPATIENT_CLINIC_OR_DEPARTMENT_OTHER): Payer: Medicare Other | Admitting: Lab

## 2012-12-06 ENCOUNTER — Ambulatory Visit: Payer: Self-pay | Admitting: Oncology

## 2012-12-06 ENCOUNTER — Other Ambulatory Visit: Payer: Self-pay | Admitting: Lab

## 2012-12-06 VITALS — BP 151/78 | HR 113 | Temp 97.1°F | Resp 20 | Ht 67.0 in | Wt 265.3 lb

## 2012-12-06 DIAGNOSIS — D72829 Elevated white blood cell count, unspecified: Secondary | ICD-10-CM

## 2012-12-06 DIAGNOSIS — L408 Other psoriasis: Secondary | ICD-10-CM

## 2012-12-06 LAB — CBC WITH DIFFERENTIAL/PLATELET
BASO%: 0.4 % (ref 0.0–2.0)
EOS%: 1.5 % (ref 0.0–7.0)
Eosinophils Absolute: 0.2 10*3/uL (ref 0.0–0.5)
MCHC: 33.6 g/dL (ref 31.5–36.0)
MCV: 82.4 fL (ref 79.5–101.0)
MONO%: 3.3 % (ref 0.0–14.0)
NEUT#: 6.7 10*3/uL — ABNORMAL HIGH (ref 1.5–6.5)
RBC: 4.38 10*6/uL (ref 3.70–5.45)
RDW: 14.5 % (ref 11.2–14.5)

## 2012-12-06 LAB — BASIC METABOLIC PANEL (CC13)
Glucose: 204 mg/dl — ABNORMAL HIGH (ref 70–140)
Potassium: 3.8 mEq/L (ref 3.5–5.1)
Sodium: 141 mEq/L (ref 136–145)

## 2012-12-06 NOTE — Progress Notes (Signed)
Hematology and Oncology Follow Up Visit  [name] [id number] 05-12-1955 57 y.o. 12/06/2012 2:58 PM [name], MDGutierrez, Teresa Canes, MD   Principle Diagnosis: 57 year old woman with leukocytosis diagnosed in October of 2013 most likely reactive in nature related to psoriasis.   Current therapy: Observation and surveillance.  Interim History: [name] presents today for a followup visit. She is a pleasant 57 year old woman initially evaluated by Dr. Dalene Casey in October 2013 for leukocytosis. Workup at that time was completely unremarkable and was likely attributed to plaque psoriasis. Since her last evaluation, she's been relatively the same. She had been on prednisone in the past although she is on any steroids at this time. She does use topical preparation for her psoriasis. She does report a little pure this but no other symptoms. She had not had any recent hospitalization or illnesses. She continued to perform activities of daily living without any hindrance or decline. She is currently under evaluation for immunosuppressive therapy for her psoriasis.  Medications: I have reviewed the patient's current medications.  Current Outpatient Prescriptions  Medication Sig Dispense Refill  . acetaminophen (TYLENOL) 500 MG tablet Take 1,000 mg by mouth every 6 (six) hours as needed. For pain.      Marland Kitchen ALPRAZolam (XANAX) 1 MG tablet Take 1 mg by mouth every 6 (six) hours as needed. For anxiety.      Marland Kitchen aspirin EC 81 MG tablet Take 81 mg by mouth at bedtime.      Marland Kitchen buPROPion (WELLBUTRIN XL) 150 MG 24 hr tablet Take 150 mg by mouth daily.      . DULoxetine (CYMBALTA) 60 MG capsule Take 60 mg by mouth every morning.       . fluticasone (FLONASE) 50 MCG/ACT nasal spray Place 2 sprays into the nose daily.      Marland Kitchen gabapentin (NEURONTIN) 800 MG tablet Take 800 mg by mouth 3 (three) times daily as needed. For neuropathy pain in feet.      Marland Kitchen glimepiride (AMARYL) 2 MG tablet Take 2 mg by mouth daily  after breakfast.       . losartan (COZAAR) 50 MG tablet Take 50 mg by mouth every morning.       . mirtazapine (REMERON) 15 MG tablet Take 15 mg by mouth at bedtime.      . Multiple Vitamin (MULTIVITAMIN WITH MINERALS) TABS Take 1 tablet by mouth every morning.      Marland Kitchen omeprazole (PRILOSEC) 20 MG capsule Take 20 mg by mouth every morning.       . senna-docusate (STOOL SOFTENER & LAXATIVE) 8.6-50 MG per tablet Take 4-5 tablets by mouth at bedtime.      . simvastatin (ZOCOR) 20 MG tablet Take 20 mg by mouth at bedtime.       . traZODone (DESYREL) 100 MG tablet Take 100-300 mg by mouth at bedtime.       No current facility-administered medications for this visit.     Allergies:  Allergies  Allergen Reactions  . Augmentin (Amoxicillin-Pot Clavulanate) Diarrhea  . Metformin And Related Diarrhea  . Tegretol (Carbamazepine) Nausea And Vomiting    Past Medical History, Surgical history, Social history, and Family History were reviewed and updated.  Review of Systems:  Remaining ROS negative. Physical Exam: Blood pressure 151/78, pulse 113, temperature 97.1 F (36.2 C), temperature source Oral, resp. rate 20, height 5\' 7"  (1.702 m), weight 265 lb 4.8 oz (120.339 kg). ECOG: 1 General appearance: alert and appears stated age Head: Normocephalic, without obvious abnormality,  atraumatic Neck: no adenopathy, no carotid bruit, no JVD, supple, symmetrical, trachea midline and thyroid not enlarged, symmetric, no tenderness/mass/nodules Lymph nodes: Cervical, supraclavicular, and axillary nodes normal. Heart:regular rate and rhythm, S1, S2 normal, no murmur, click, rub or gallop Lung:chest clear, no wheezing, rales, normal symmetric air entry Abdomin: soft, non-tender, without masses or organomegaly EXT:no erythema, induration, or nodules Skin: Plaque lesions noted on the arm and legs  Lab Results: Lab Results  Component Value Date   WBC 10.7* 12/06/2012   HGB 12.1 12/06/2012   HCT 36.0  12/06/2012   MCV 82.4 12/06/2012   PLT 292 12/06/2012     Chemistry      Component Value Date/Time   NA 140 05/07/2012 1325   NA 141 03/13/2012 1430   K 3.8 05/07/2012 1325   K 3.7 03/13/2012 1430   CL 100 05/07/2012 1325   CL 107 03/13/2012 1430   CO2 29 05/07/2012 1325   CO2 27 03/13/2012 1430   BUN 7 05/07/2012 1325   BUN 11.0 03/13/2012 1430   CREATININE 0.92 05/07/2012 1325   CREATININE 1.0 03/13/2012 1430      Component Value Date/Time   CALCIUM 9.3 05/07/2012 1325   CALCIUM 8.8 03/13/2012 1430   ALKPHOS 134* 05/07/2012 1325   ALKPHOS 146 03/13/2012 1430   AST 20 05/07/2012 1325   AST 20 03/13/2012 1430   ALT 12 05/07/2012 1325   ALT 18 03/13/2012 1430   BILITOT 0.3 05/07/2012 1325   BILITOT 0.26 03/13/2012 1430       Impression and Plan:  57 year old woman with leukocytosis dating back to October of 2013. Her white cell count at that time was around 14,000 but since then it has continuously declined and her white cell count today is 10.7 which close to normal range. Her differential on her white cell count was completely normal and proportional. This is most likely due to reactive finding from psoriasis of psoriasis treatment. She had been on steroids orally and topically which can certainly increase her white cell count. I see nothing to suggest a hematological disorder or any further workup at this time. I will be happy to see her in the future as needed.  Teresa Hose, MD 8/7/20142:58 PM

## 2013-03-05 ENCOUNTER — Encounter: Payer: Self-pay | Admitting: Gastroenterology

## 2013-09-19 ENCOUNTER — Ambulatory Visit: Payer: Self-pay | Admitting: Family Medicine

## 2013-11-13 ENCOUNTER — Encounter: Payer: Self-pay | Admitting: Podiatrist

## 2013-11-13 ENCOUNTER — Ambulatory Visit (INDEPENDENT_AMBULATORY_CARE_PROVIDER_SITE_OTHER): Payer: Medicare Other | Admitting: Podiatrist

## 2013-11-13 VITALS — BP 146/82 | HR 99 | Resp 18

## 2013-11-13 DIAGNOSIS — E1140 Type 2 diabetes mellitus with diabetic neuropathy, unspecified: Secondary | ICD-10-CM

## 2013-11-13 DIAGNOSIS — E1149 Type 2 diabetes mellitus with other diabetic neurological complication: Secondary | ICD-10-CM

## 2013-11-13 DIAGNOSIS — E1142 Type 2 diabetes mellitus with diabetic polyneuropathy: Secondary | ICD-10-CM

## 2013-11-13 DIAGNOSIS — L600 Ingrowing nail: Secondary | ICD-10-CM

## 2013-11-13 NOTE — Progress Notes (Signed)
   Subjective:    Patient ID: [name], female    DOB: 09/07/1955, 58 y.o.   MRN: [id number]  HPI I AM DIABETIC AND HAVE BEEN FOR ABOUT 6 YEARS AND I HAVE NEUROPATHY AND SORE AND TENDER AND RIGHT HEEL BROKE IN HALF IN 2006 AND DR DUDA DONE IT AND 3RD AND 4TH AND 5TH TOES ARE DAMAGED AND GOING INTO BIG TOE ON THE RIGHT FOOT AND NUMBNESS AND BIG TOE ON RIGHT HAS SOME THROBBING AND BURNING    Review of Systems  All other systems reviewed and are negative.      Objective:   Physical Exam Patient is awake, alert, and oriented x 3.  In no acute distress.  Vascular status is intact with palpable pedal pulses at 2/4 DP and PT bilateral and capillary refill time within normal limits. Neurological sensation is decreased bilaterally via Semmes Weinstein monofilament at 2/5 sites. Light touch, vibratory sensation are decreased, Achilles tendon reflex is intact. Dermatological exam reveals skin color, turger and texture as normal on the left- there is a callus on the medial aspect of the right great toe. .  On the right she has had surgery on her heel for a fracture which was fixed by Dr. Lajoyce Cornersuda-- there is tendon contracture and a shortened heel.  Otherwise musculature intact with dorsiflexion, plantarflexion, inversion, eversion.      Assessment & Plan:  Neuropathy, shortened heel due to previous fracture, diabetes  Plan:  She is already on gabapentin for another diagnosis and I recommended she increase the dosage to 4 times a day to be at a therapeutic dose.  She will call after 3 weeks if there is no difference and I will start her on lyrica.  Recommended diabetic shoes due to her orthopedic abnormalities, neuropathy and risk of ulceration.  We will seek approval from her primary care physician for the shoes.

## 2013-11-13 NOTE — Patient Instructions (Signed)
DIABETIC SHOES-  Diabetic shoes and accomidative inserts are indicated for those persons who have Diabetes and who are at an increased for a foot ulceration.  This requires patients to have decreased feeling in the feet, circulation problems, as well as an abnormal foot structure, or a combination of these disorders.  As podiatrists, we can recommend a diabetic shoe to your primary care doctor or endocrinologist HOWEVER, it is up to them to decide if you are eligible or in need of these shoes.  Before we can take measurements or do the ordering of a diabetic shoe and accomidative insert, it is required that we get a signed authorization from your physician both confirming you have Diabetes and that you are at risk for an ulceration.  Please note, if you physician will not sign for the shoes, there is no way we can go around him or her.  You are welcome to place an order for the shoes, however the financial responsibility is up to you and your insurance cannot be billed.   Once, approval is granted for the diabetic shoes and inserts we will make you a separate appointment to be measured, casted, and for you to choose the diabetic shoe.  We will then call you when they are ready for dispensing and you will be seen to make sure the shoes and inserts fit your foot properly.  As this is a multi-step process, it generally takes 4-8 weeks from start to finish.  You cooperation is appreciated.  If it has been more than 6 weeks from the date you ordered your diabetic shoe and you have not heard from our office, please give us a call.      Diabetes and Foot Care Diabetes may cause you to have problems because of poor blood supply (circulation) to your feet and legs. This may cause the skin on your feet to become thinner, break easier, and heal more slowly. Your skin may become dry, and the skin may peel and crack. You may also have nerve damage in your legs and feet causing decreased feeling in them. You may not  notice minor injuries to your feet that could lead to infections or more serious problems. Taking care of your feet is one of the most important things you can do for yourself.  HOME CARE INSTRUCTIONS  Wear shoes at all times, even in the house. Do not go barefoot. Bare feet are easily injured.  Check your feet daily for blisters, cuts, and redness. If you cannot see the bottom of your feet, use a mirror or ask someone for help.  Wash your feet with warm water (do not use hot water) and mild soap. Then pat your feet and the areas between your toes until they are completely dry. Do not soak your feet as this can dry your skin.  Apply a moisturizing lotion or petroleum jelly (that does not contain alcohol and is unscented) to the skin on your feet and to dry, brittle toenails. Do not apply lotion between your toes.  Trim your toenails straight across. Do not dig under them or around the cuticle. File the edges of your nails with an emery board or nail file.  Do not cut corns or calluses or try to remove them with medicine.  Wear clean socks or stockings every day. Make sure they are not too tight. Do not wear knee-high stockings since they may decrease blood flow to your legs.  Wear shoes that fit properly and have enough   cushioning. To break in new shoes, wear them for just a few hours a day. This prevents you from injuring your feet. Always look in your shoes before you put them on to be sure there are no objects inside.  Do not cross your legs. This may decrease the blood flow to your feet.  If you find a minor scrape, cut, or break in the skin on your feet, keep it and the skin around it clean and dry. These areas may be cleansed with mild soap and water. Do not cleanse the area with peroxide, alcohol, or iodine.  When you remove an adhesive bandage, be sure not to damage the skin around it.  If you have a wound, look at it several times a day to make sure it is healing.  Do not use  heating pads or hot water bottles. They may burn your skin. If you have lost feeling in your feet or legs, you may not know it is happening until it is too late.  Make sure your health care provider performs a complete foot exam at least annually or more often if you have foot problems. Report any cuts, sores, or bruises to your health care provider immediately. SEEK MEDICAL CARE IF:   You have an injury that is not healing.  You have cuts or breaks in the skin.  You have an ingrown nail.  You notice redness on your legs or feet.  You feel burning or tingling in your legs or feet.  You have pain or cramps in your legs and feet.  Your legs or feet are numb.  Your feet always feel cold. SEEK IMMEDIATE MEDICAL CARE IF:   There is increasing redness, swelling, or pain in or around a wound.  There is a red line that goes up your leg.  Pus is coming from a wound.  You develop a fever or as directed by your health care provider.  You notice a bad smell coming from an ulcer or wound. Document Released: 04/15/2000 Document Revised: 12/19/2012 Document Reviewed: 09/25/2012 [employer] Patient Information 2015 [employer]. This information is not intended to replace advice given to you by your health care provider. Make sure you discuss any questions you have with your health care provider.  

## 2013-12-02 ENCOUNTER — Telehealth: Payer: Self-pay | Admitting: *Deleted

## 2013-12-02 NOTE — Telephone Encounter (Signed)
Saw her 2-3 weeks ago.  Determined I have Neuropathy.  I was already taking Gabapentin 800 mg three times a day.  She asked me if I could take it 4 times a day.  I did, now I've ran out but it wasn't doing any good.  I hurt so bad Saturday, I thought I was going to have to go to the emergency room.  She mentioned something that starts with a "L" to start me on.  Call me back and let me know what's going on.

## 2013-12-03 NOTE — Telephone Encounter (Signed)
Refer her over to dr. Freida Busmanalton bathea-- if the neurontin isn't helping she may need totally different medications.  I doubt the addition of Lyrica would help.  thanks

## 2013-12-03 NOTE — Telephone Encounter (Signed)
I called and left the patient a message that Dr. Irving ShowsEgerton feels that Lyrica will not help.  She wants to refer you to Dr. Herminio HeadsShawn Dalton-Bethea.  She treats chronic pain using physical medicine.  The [address] [place] [address] [place], [place] [address].  Phone number is [phone].  Please call if you have any questions.  I'm making the referral now.

## 2013-12-06 ENCOUNTER — Telehealth: Payer: Self-pay | Admitting: *Deleted

## 2013-12-06 NOTE — Telephone Encounter (Signed)
I called and informed her it takes a few weeks to process shoes.  We will call once we get authorization from your doctor.  I asked who her doctor is and what's his address.  She said Dr. Clarene DukeLittle and he is with [facility] on [address]  I told her we will call once it's authorized.  She stated okay thank you.

## 2013-12-06 NOTE — Telephone Encounter (Signed)
I came in several weeks ago.  I set the appointment up with Barbette MerinoShawn Bethea.  I had talked to Dr. Irving ShowsEgerton about some shoes that I had wanted.  She said you would have to get paperwork to my primary care doctor.  I'm calling to check on the status of that.  I need those shoes for the winter.

## 2014-01-08 ENCOUNTER — Telehealth: Payer: Self-pay | Admitting: *Deleted

## 2014-01-08 NOTE — Telephone Encounter (Signed)
This message is for [name]  I was waiting to hear back from you about if you received the paperwork from my primary care doctor, Dr. Catha Gosselin at [place] [facility].  I haven't heard anything and I was going to need those shoes like today.  Definitely since it's getting colder, I don't have but one pair of sandals.  I just want to make sure the paperwork is on the right route and I can come in and get fitted and everything as soon as possible.  I'd greatly appreciate an update, thank you so very much.  I checked [employer] and saw that patient's request had been canceled.  She does not meet the criteria because she has not seen Dr. Clarene Duke within the last 6 months.  I advised her to schedule a follow-up visit with Dr. Clarene Duke and let us know.  Call if you have any questions or concerns.

## 2014-01-13 ENCOUNTER — Telehealth: Payer: Self-pay | Admitting: *Deleted

## 2014-01-13 NOTE — Telephone Encounter (Signed)
We were trying to get through to Medicare to get me some shoes from over there.  You had left me a message that I needed to see my physician first.  I did have an appointment over there today and I had them to fax it.  I don't know if they sent it to you or sent it to Medicare.  He did do an exam today and faxed it.  I know I won't hear from you today but sometime next week.  Thank you so much for your help.

## 2014-01-15 NOTE — Telephone Encounter (Signed)
I called and left her a message that I checked with [employer] and your doctor did send them the needed forms.  You need to give [place] a call to schedule an appointment for shoe measurements.

## 2014-01-17 ENCOUNTER — Ambulatory Visit: Payer: Medicare Other

## 2014-01-17 DIAGNOSIS — E1140 Type 2 diabetes mellitus with diabetic neuropathy, unspecified: Secondary | ICD-10-CM

## 2014-01-17 NOTE — Progress Notes (Signed)
Pt was measured for diabetic shoes

## 2014-02-28 ENCOUNTER — Ambulatory Visit (INDEPENDENT_AMBULATORY_CARE_PROVIDER_SITE_OTHER): Payer: Medicare Other | Admitting: Podiatrist

## 2014-02-28 DIAGNOSIS — E0840 Diabetes mellitus due to underlying condition with diabetic neuropathy, unspecified: Secondary | ICD-10-CM

## 2014-02-28 DIAGNOSIS — E1140 Type 2 diabetes mellitus with diabetic neuropathy, unspecified: Secondary | ICD-10-CM

## 2014-02-28 DIAGNOSIS — M216X9 Other acquired deformities of unspecified foot: Secondary | ICD-10-CM

## 2014-03-04 NOTE — Progress Notes (Signed)
Patient presents today for diabetic shoe measurement. This was carried out by Clifton JamesJessica Quintana

## 2014-05-30 ENCOUNTER — Emergency Department (HOSPITAL_COMMUNITY)
Admission: EM | Admit: 2014-05-30 | Discharge: 2014-05-31 | Disposition: A | Discharge status: Home / Self Care | Payer: Medicare Other | Attending: Emergency Medicine | Admitting: Emergency Medicine

## 2014-05-30 ENCOUNTER — Emergency Department (HOSPITAL_COMMUNITY): Payer: Medicare Other

## 2014-05-30 ENCOUNTER — Encounter (HOSPITAL_COMMUNITY): Payer: Self-pay | Admitting: Emergency Medicine

## 2014-05-30 DIAGNOSIS — Z86718 Personal history of other venous thrombosis and embolism: Secondary | ICD-10-CM | POA: Diagnosis not present

## 2014-05-30 DIAGNOSIS — Y9389 Activity, other specified: Secondary | ICD-10-CM | POA: Insufficient documentation

## 2014-05-30 DIAGNOSIS — Y998 Other external cause status: Secondary | ICD-10-CM | POA: Insufficient documentation

## 2014-05-30 DIAGNOSIS — S0990XA Unspecified injury of head, initial encounter: Secondary | ICD-10-CM | POA: Diagnosis present

## 2014-05-30 DIAGNOSIS — Z8619 Personal history of other infectious and parasitic diseases: Secondary | ICD-10-CM | POA: Insufficient documentation

## 2014-05-30 DIAGNOSIS — Z79899 Other long term (current) drug therapy: Secondary | ICD-10-CM | POA: Insufficient documentation

## 2014-05-30 DIAGNOSIS — Z86711 Personal history of pulmonary embolism: Secondary | ICD-10-CM | POA: Insufficient documentation

## 2014-05-30 DIAGNOSIS — Y9289 Other specified places as the place of occurrence of the external cause: Secondary | ICD-10-CM | POA: Insufficient documentation

## 2014-05-30 DIAGNOSIS — S0101XA Laceration without foreign body of scalp, initial encounter: Secondary | ICD-10-CM | POA: Diagnosis not present

## 2014-05-30 DIAGNOSIS — K219 Gastro-esophageal reflux disease without esophagitis: Secondary | ICD-10-CM | POA: Insufficient documentation

## 2014-05-30 DIAGNOSIS — S39012A Strain of muscle, fascia and tendon of lower back, initial encounter: Secondary | ICD-10-CM | POA: Diagnosis not present

## 2014-05-30 DIAGNOSIS — S199XXA Unspecified injury of neck, initial encounter: Secondary | ICD-10-CM | POA: Diagnosis not present

## 2014-05-30 DIAGNOSIS — W01198A Fall on same level from slipping, tripping and stumbling with subsequent striking against other object, initial encounter: Secondary | ICD-10-CM | POA: Diagnosis not present

## 2014-05-30 DIAGNOSIS — Z862 Personal history of diseases of the blood and blood-forming organs and certain disorders involving the immune mechanism: Secondary | ICD-10-CM | POA: Insufficient documentation

## 2014-05-30 DIAGNOSIS — W19XXXA Unspecified fall, initial encounter: Secondary | ICD-10-CM

## 2014-05-30 DIAGNOSIS — F329 Major depressive disorder, single episode, unspecified: Secondary | ICD-10-CM | POA: Diagnosis not present

## 2014-05-30 DIAGNOSIS — E119 Type 2 diabetes mellitus without complications: Secondary | ICD-10-CM | POA: Diagnosis not present

## 2014-05-30 DIAGNOSIS — Z7982 Long term (current) use of aspirin: Secondary | ICD-10-CM | POA: Insufficient documentation

## 2014-05-30 DIAGNOSIS — R011 Cardiac murmur, unspecified: Secondary | ICD-10-CM | POA: Insufficient documentation

## 2014-05-30 DIAGNOSIS — G709 Myoneural disorder, unspecified: Secondary | ICD-10-CM | POA: Diagnosis not present

## 2014-05-30 DIAGNOSIS — Z23 Encounter for immunization: Secondary | ICD-10-CM | POA: Insufficient documentation

## 2014-05-30 DIAGNOSIS — Z7951 Long term (current) use of inhaled steroids: Secondary | ICD-10-CM | POA: Insufficient documentation

## 2014-05-30 LAB — I-STAT CHEM 8, ED
BUN: 18 mg/dL (ref 6–23)
CHLORIDE: 101 mmol/L (ref 96–112)
Calcium, Ion: 1.14 mmol/L (ref 1.13–1.30)
Creatinine, Ser: 0.8 mg/dL (ref 0.50–1.10)
Glucose, Bld: 91 mg/dL (ref 70–99)
HEMATOCRIT: 41 % (ref 36.0–46.0)
Hemoglobin: 13.9 g/dL (ref 12.0–15.0)
Potassium: 3.8 mmol/L (ref 3.5–5.1)
Sodium: 144 mmol/L (ref 135–145)
TCO2: 28 mmol/L (ref 0–100)

## 2014-05-30 LAB — CBG MONITORING, ED: GLUCOSE-CAPILLARY: 86 mg/dL (ref 70–99)

## 2014-05-30 MED ORDER — TETANUS-DIPHTH-ACELL PERTUSSIS 5-2.5-18.5 LF-MCG/0.5 IM SUSP
0.5000 mL | Freq: Once | INTRAMUSCULAR | Status: AC
  Administered 2014-05-30: 0.5 mL via INTRAMUSCULAR
  Filled 2014-05-30: qty 0.5

## 2014-05-30 MED ORDER — MORPHINE SULFATE 4 MG/ML IJ SOLN: 4.0000 mg | Freq: Once | INTRAMUSCULAR | Status: DC

## 2014-05-30 MED ORDER — HYDROMORPHONE HCL 1 MG/ML IJ SOLN
0.5000 mg | Freq: Once | INTRAMUSCULAR | Status: AC
  Administered 2014-05-30: 0.5 mg via INTRAVENOUS
  Filled 2014-05-30: qty 1

## 2014-05-30 MED ORDER — ONDANSETRON HCL 4 MG/2ML IJ SOLN
4.0000 mg | Freq: Once | INTRAMUSCULAR | Status: AC
  Administered 2014-05-30: 4 mg via INTRAVENOUS
  Filled 2014-05-30: qty 2

## 2014-05-30 NOTE — ED Notes (Signed)
Pt transported via EMS from home, pt states @ 1830 she tripped and fell hitting back of head on door jam. Denies LOC, pt c/o neck discomfort, ccollar applied by GFD. A & O. Pt ambulatory at home with steady gait.

## 2014-05-30 NOTE — ED Notes (Signed)
Patient transported to CT, anticipate delay in labs, ekg, and meds admin

## 2014-05-30 NOTE — ED Notes (Signed)
Patient transported to X-ray 

## 2014-05-30 NOTE — ED Provider Notes (Signed)
CSN: [id number]     Arrival date & time 05/30/14  1924 History   This chart was scribed for Teresa Madura, PA-C working with [name], MD by [name], ED Scribe. This patient was seen in room WTR8/WTR8 and the patient's care was started at 9:23 PM.     Chief Complaint  Patient presents with  . Fall   Patient is a 59 y.o. female presenting with fall. The history is provided by the patient. No language interpreter was used.  Fall Associated symptoms include headaches.   HPI Comments: [name] is a 59 y.o. female who presents to the Emergency Department complaining of fall onset today PTA. Pt states she prior to falling she felt light headed and fell backwards hitting her head on the door hinge. Pt presents with throbbing HA, laceration to posterior aspect of head, neck pain and low back pain. Pt states that she then fell down onto her buttocks and then to the right. Pt denies LOC. Pt denies visual disturbance, photophobia, hearing loss, tinnitus, numbness, weakness or bowel/bladder incontinence. Pt states that she is unsure is her tetanus is UTD.    Past Medical History  Diagnosis Date  . Weight gain   . Night sweats   . Fatigue     due to loss of sleep  . Herpes   . Diabetes mellitus   . GERD (gastroesophageal reflux disease)   . Hx of blood clots   . Contact lens/glasses fitting   . Morbid obesity   . Pulmonary embolism 2009  . Fracture of right heel 2006  . 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 Depression   . Suicidal ideation   . PONV (postoperative nausea and vomiting)   . Mitral valve prolapse     asymptomatic   . 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 Anemia     hx of    Past Surgical History  Procedure Laterality Date  . Eye surgery  03/2001    lasik  . Hysteroscopy  2002  . Laparoscopy  2002  . Abdominal hysterectomy  2003  . Pseudotumor cerebri    . Breath tek h pylori  01/09/2012    Procedure: BREATH TEK H  PYLORI;  Surgeon: Kandis Cocking, MD;  Location: [facility];  Service: General;  Laterality: N/A;  . Right heel fracture       surgery due to trauma- 2006    Family History  Problem Relation Age of Onset  . Adopted: Yes   History  Substance Use Topics  . Smoking status: Never Smoker   . Smokeless tobacco: Never Used  . Alcohol Use: No   OB History    No data available      Review of Systems  HENT: Negative for hearing loss and tinnitus.   Eyes: Negative for photophobia and visual disturbance.  Genitourinary: Negative.   Musculoskeletal: Positive for back pain and neck pain.  Skin: Positive for wound.  Neurological: Positive for light-headedness and headaches. Negative for syncope, weakness and numbness.  All other systems reviewed and are negative.   Allergies  Augmentin; Metformin and related; and Tegretol  Home Medications   Prior to Admission medications   Medication Sig Start Date End Date Taking? Authorizing Provider  ALPRAZolam [name]) 1 MG tablet Take 1 mg by mouth every 6 (six) hours as needed. For anxiety. 02/21/12  Yes [name], MD  aspirin EC 81  MG tablet Take 81 mg by mouth at bedtime.   Yes Historical Provider, MD  buPROPion (WELLBUTRIN XL) 150 MG 24 hr tablet Take 150 mg by mouth daily.   Yes Historical Provider, MD  doxepin (SINEQUAN) 25 MG capsule Take 75 mg by mouth at bedtime.   Yes Historical Provider, MD  gabapentin (NEURONTIN) 800 MG tablet Take 800 mg by mouth 3 (three) times daily as needed. For neuropathy pain in feet.   Yes Historical Provider, MD  glimepiride (AMARYL) 2 MG tablet Take 2 mg by mouth at bedtime.  06/30/11  Yes Historical Provider, MD  losartan (COZAAR) 50 MG tablet Take 50 mg by mouth every morning.  06/30/11  Yes Historical Provider, MD  mirtazapine (REMERON) 15 MG tablet Take 15 mg by mouth at bedtime. 02/21/12  Yes [name], MD  Multiple Vitamin (MULTIVITAMIN WITH MINERALS) TABS Take 1 tablet by mouth every morning.    Yes Historical Provider, MD  omeprazole (PRILOSEC) 20 MG capsule Take 20 mg by mouth every morning.  07/01/11  Yes Historical Provider, MD  simvastatin (ZOCOR) 20 MG tablet Take 20 mg by mouth at bedtime.  06/30/11  Yes Historical Provider, MD  traZODone (DESYREL) 100 MG tablet Take 100-300 mg by mouth at bedtime. 02/21/12  Yes [name], MD  zolpidem (AMBIEN) 10 MG tablet Take 10 mg by mouth at bedtime as needed for sleep.  10/24/13  Yes Historical Provider, MD  acetaminophen (TYLENOL) 500 MG tablet Take 1,000 mg by mouth every 6 (six) hours as needed. For pain.    Historical Provider, MD  DULoxetine (CYMBALTA) 60 MG capsule Take 60 mg by mouth every morning.     Historical Provider, MD  fluticasone (FLONASE) 50 MCG/ACT nasal spray Place 2 sprays into the nose daily.    Historical Provider, MD  senna-docusate (STOOL SOFTENER & LAXATIVE) 8.6-50 MG per tablet Take 4-5 tablets by mouth at bedtime.    Historical Provider, MD   BP 128/65 mmHg  Pulse 87  Resp 12  SpO2 97%   Physical Exam  Constitutional: She is oriented to person, place, and time. She appears well-developed and well-nourished. No distress.  Nontoxic/nonseptic appearing  HENT:  Head: Normocephalic.  Mouth/Throat: Oropharynx is clear and moist. No oropharyngeal exudate.  Approximately 5.5 cm linear laceration to posterior scalp. No Battle sign or raccoons eyes.  Eyes: Conjunctivae and EOM are normal. Pupils are equal, round, and reactive to light. No scleral icterus.  Neck:  Cervical spine immobilized in cervical collar  Cardiovascular: Normal rate, regular rhythm and intact distal pulses.   DP and PT pulses 2+ bilaterally  Pulmonary/Chest: Effort normal and breath sounds normal. No respiratory distress. She has no wheezes. She has no rales.  Respirations even and unlabored  Musculoskeletal: Normal range of motion.       Lumbar back: She exhibits pain. She exhibits normal range of motion and no deformity.  Neurological: She  is alert and oriented to person, place, and time. She has normal reflexes. No cranial nerve deficit. She exhibits normal muscle tone. Coordination normal.  GCS 15. Speech is goal oriented. No focal neurologic deficits appreciated. Patient moves extremities without ataxia.  Skin: Skin is warm and dry. No rash noted. She is not diaphoretic. No erythema. No pallor.  Psychiatric: She has a normal mood and affect. Her behavior is normal.  Nursing note and vitals reviewed.   ED Course  Procedures (including critical care time) DIAGNOSTIC STUDIES: Oxygen Saturation is 96% on RA, adequate by my  interpretation.    COORDINATION OF CARE: 9:30 PM-Discussed treatment plan with pt at bedside and pt agreed to plan.   Labs Review Labs Reviewed  CBG MONITORING, ED  I-STAT CHEM 8, ED    Imaging Review Dg Lumbar Spine Complete  05/30/2014   CLINICAL DATA:  Pt states she fell at home and landed on right side. Pt complains of right lower back pain.  EXAM: LUMBAR SPINE - COMPLETE 4+ VIEW  COMPARISON:  None.  FINDINGS: No fracture. No spondylolisthesis. Moderate loss disc height at L 4-L5 with mild loss of disc height at L3-L4 and L5-S1. Endplate osteophytes are noted most prominent at L3-L4. There is mild facet joint narrowing on the left at L4-L5 and L5-S1.  Soft tissues are unremarkable.  IMPRESSION: No fracture or acute finding.   Electronically Signed   By: Amie Portland M.D.   On: 05/30/2014 22:13   Ct Head Wo Contrast  05/30/2014   CLINICAL DATA:  female who presents to the Emergency Department complaining of fall onset today PTA. Pt states she prior to falling she felt light headed and fell backwards hitting her head on the door hinge. Pt presents with throbbing HA, laceration to posterior aspect of head, neck pain and low back pain. Pt states that she then fell down onto her buttocks and then to the right. Pt denies LOC.  EXAM: CT HEAD WITHOUT CONTRAST  CT CERVICAL SPINE WITHOUT CONTRAST  TECHNIQUE:  Multidetector CT imaging of the head and cervical spine was performed following the standard protocol without intravenous contrast. Multiplanar CT image reconstructions of the cervical spine were also generated.  COMPARISON:  02/29/2008  FINDINGS: CT HEAD FINDINGS  Ventricles normal in size and configuration. No parenchymal masses of mass effect. There is no evidence of a cortical infarct. Subtle white matter hypoattenuation is noted most consistent with chronic microvascular ischemic change.  There are no extra-axial masses or abnormal fluid collections.  There is no intracranial hemorrhage.  Small posterior scalp contusion/laceration. No radiopaque foreign body. No skull fracture.  Sinuses and mastoid air cells are clear.  CT CERVICAL SPINE FINDINGS  No fracture. No spondylolisthesis. There is mild loss disc height at C5-C6 and C6-C7. Endplate spurring is noted from C2 through the upper thoracic spine. Uncovertebral spurring leads to neural foraminal narrowing multiple levels, greatest at C5-C6, moderate on the right and moderate to severe on the left.  Thyroid gland is enlarged and somewhat heterogeneous likely with multiple ill-defined nodules. Soft tissues are otherwise unremarkable. Lung apices are clear.  IMPRESSION: HEAD CT:  No acute intracranial abnormality.  No skull fracture.  CERVICAL CT:  No fracture or acute finding.   Electronically Signed   By: Amie Portland M.D.   On: 05/30/2014 22:29   Ct Cervical Spine Wo Contrast  05/30/2014   CLINICAL DATA:  female who presents to the Emergency Department complaining of fall onset today PTA. Pt states she prior to falling she felt light headed and fell backwards hitting her head on the door hinge. Pt presents with throbbing HA, laceration to posterior aspect of head, neck pain and low back pain. Pt states that she then fell down onto her buttocks and then to the right. Pt denies LOC.  EXAM: CT HEAD WITHOUT CONTRAST  CT CERVICAL SPINE WITHOUT CONTRAST   TECHNIQUE: Multidetector CT imaging of the head and cervical spine was performed following the standard protocol without intravenous contrast. Multiplanar CT image reconstructions of the cervical spine were also generated.  COMPARISON:  02/29/2008  FINDINGS: CT HEAD FINDINGS  Ventricles normal in size and configuration. No parenchymal masses of mass effect. There is no evidence of a cortical infarct. Subtle white matter hypoattenuation is noted most consistent with chronic microvascular ischemic change.  There are no extra-axial masses or abnormal fluid collections.  There is no intracranial hemorrhage.  Small posterior scalp contusion/laceration. No radiopaque foreign body. No skull fracture.  Sinuses and mastoid air cells are clear.  CT CERVICAL SPINE FINDINGS  No fracture. No spondylolisthesis. There is mild loss disc height at C5-C6 and C6-C7. Endplate spurring is noted from C2 through the upper thoracic spine. Uncovertebral spurring leads to neural foraminal narrowing multiple levels, greatest at C5-C6, moderate on the right and moderate to severe on the left.  Thyroid gland is enlarged and somewhat heterogeneous likely with multiple ill-defined nodules. Soft tissues are otherwise unremarkable. Lung apices are clear.  IMPRESSION: HEAD CT:  No acute intracranial abnormality.  No skull fracture.  CERVICAL CT:  No fracture or acute finding.   Electronically Signed   By: Amie Portlandavid  Ormond M.D.   On: 05/30/2014 22:29     EKG Interpretation   Date/Time:  Friday May 30 2014 22:30:49 EST Ventricular Rate:  87 PR Interval:  155 QRS Duration: 86 QT Interval:  436 QTC Calculation: 525 R Axis:   35 Text Interpretation:  Sinus rhythm Low voltage, precordial leads  Borderline T abnormalities, anterior leads Prolonged QT interval Baseline  wander in lead(s) V6 No significant change since last tracing Confirmed by  KNAPP  MD-J, JON ([id number]) on 05/30/2014 11:27:47 PM      LACERATION REPAIR Performed by:  Teresa MaduraHUMES, Fiana Gladu Authorized by: Teresa MaduraHUMES, Thuy Atilano Consent: Verbal consent obtained. Risks and benefits: risks, benefits and alternatives were discussed Consent given by: patient Patient identity confirmed: provided demographic data Prepped and Draped in normal sterile fashion Wound explored  Laceration Location: scalp  Laceration Length: 5.5cm  No Foreign Bodies seen or palpated  Anesthesia: none  Local anesthetic: none  Anesthetic total: n/a  Irrigation method: syringe Amount of cleaning: standard  Skin closure: staples  Number of sutures: 4  Technique: simple  Patient tolerance: Patient tolerated the procedure well with no immediate complications.   Medications  ondansetron (ZOFRAN) injection 4 mg (4 mg Intravenous Given 05/30/14 2316)  Tdap (BOOSTRIX) injection 0.5 mL (0.5 mLs Intramuscular Given 05/30/14 2317)  HYDROmorphone (DILAUDID) injection 0.5 mg (0.5 mg Intravenous Given 05/30/14 2317)  oxyCODONE-acetaminophen (PERCOCET/ROXICET) 5-325 MG per tablet 2 tablet (2 tablets Oral Given 05/31/14 0117)  diazepam (VALIUM) tablet 5 mg (5 mg Oral Given 05/31/14 0118)    MDM   Final diagnoses:  Scalp laceration, initial encounter  Fall, initial encounter  Low back strain, initial encounter   59 year old female presents to the emergency department for further evaluation of symptoms following a fall. She complained of lightheadedness without syncope prior to her fall which she believes was secondary to low blood sugar as she was at a funeral for most of the day. CBG on arrival was 86. Physical exam revealed a 5.5 cm laceration to the posterior scalp. No active bleeding. No Battle sign or raccoons eyes. Patient with a normal neurologic exam today. No red flags or signs concerning for cauda equina.   Patient with a reassuring workup in the ED. Per [facility] syncope rule applied in this patient's case of near syncope, patient is low risk for any serious outcome. Her CBG has remained  stable and she has been eating peanut butter crackers and  a square of Gihrardelli chocolate. No indication for further workup at this time. Patient to be discharged with instruction of follow-up with her primary care doctor for staple removal in 5-7 days. Return precautions discussed and provided. Patient agreeable to plan with no unaddressed concerns. Patient discharged in good condition; VSS.  I personally performed the services described in this documentation, which was scribed in my presence. The recorded information has been reviewed and is accurate.   Filed Vitals:   05/30/14 1925 05/30/14 2244 05/31/14 0118  BP:  128/65 131/56  Pulse:  87 83  Resp:  12 14  SpO2: 96% 97% 97%      Teresa Madura, PA-C 05/31/14 0451  [name], MD 05/31/14 [id number]

## 2014-05-30 NOTE — Progress Notes (Signed)
CSW met with patient at bedside. There was no family present. Patient confirms that she presents to the ED tonight due to falling today. Patient states she fell while at home and landed on her right side. Patient informed CSW that she broke her right heel in 2006, and now she walks off balance. Patient states that she usually walks with a cane.   Patient states that she does not fall often. Patient says that prior to coming into the ED she has been able to complete her ADL's independently. Patient states that she feels horrible.  According to patient, she is a widow and lives home alone in [place].   Willette Brace [phone] ED CSW 05/30/2014 9:57 PM

## 2014-05-31 MED ORDER — OXYCODONE-ACETAMINOPHEN 5-325 MG PO TABS: 1.0000 | ORAL_TABLET | Freq: Four times a day (QID) | ORAL | Status: DC | PRN

## 2014-05-31 MED ORDER — OXYCODONE-ACETAMINOPHEN 5-325 MG PO TABS
2.0000 | ORAL_TABLET | Freq: Once | ORAL | Status: AC
  Administered 2014-05-31: 2 via ORAL
  Filled 2014-05-31: qty 2

## 2014-05-31 MED ORDER — DIAZEPAM 5 MG PO TABS: 5.0000 mg | ORAL_TABLET | Freq: Two times a day (BID) | ORAL | Status: DC

## 2014-05-31 MED ORDER — DIAZEPAM 5 MG PO TABS
5.0000 mg | ORAL_TABLET | Freq: Once | ORAL | Status: AC
  Administered 2014-05-31: 5 mg via ORAL
  Filled 2014-05-31: qty 1

## 2014-05-31 NOTE — Discharge Instructions (Signed)
Laceration Care, Adult °A laceration is a cut or lesion that goes through all layers of the skin and into the tissue just beneath the skin. °TREATMENT  °Some lacerations may not require closure. Some lacerations may not be able to be closed due to an increased risk of infection. It is important to see your caregiver as soon as possible after an injury to minimize the risk of infection and maximize the opportunity for successful closure. °If closure is appropriate, pain medicines may be given, if needed. The wound will be cleaned to help prevent infection. Your caregiver will use stitches (sutures), staples, wound glue (adhesive), or skin adhesive strips to repair the laceration. These tools bring the skin edges together to allow for faster healing and a better cosmetic outcome. However, all wounds will heal with a scar. Once the wound has healed, scarring can be minimized by covering the wound with sunscreen during the day for 1 full year. °HOME CARE INSTRUCTIONS  °For sutures or staples: °· Keep the wound clean and dry. °· If you were given a bandage (dressing), you should change it at least once a day. Also, change the dressing if it becomes wet or dirty, or as directed by your caregiver. °· Wash the wound with soap and water 2 times a day. Rinse the wound off with water to remove all soap. Pat the wound dry with a clean towel. °· After cleaning, apply a thin layer of the antibiotic ointment as recommended by your caregiver. This will help prevent infection and keep the dressing from sticking. °· You may shower as usual after the first 24 hours. Do not soak the wound in water until the sutures are removed. °· Only take over-the-counter or prescription medicines for pain, discomfort, or fever as directed by your caregiver. °· Get your sutures or staples removed as directed by your caregiver. °For skin adhesive strips: °· Keep the wound clean and dry. °· Do not get the skin adhesive strips wet. You may bathe  carefully, using caution to keep the wound dry. °· If the wound gets wet, pat it dry with a clean towel. °· Skin adhesive strips will fall off on their own. You may trim the strips as the wound heals. Do not remove skin adhesive strips that are still stuck to the wound. They will fall off in time. °For wound adhesive: °· You may briefly wet your wound in the shower or bath. Do not soak or scrub the wound. Do not swim. Avoid periods of heavy perspiration until the skin adhesive has fallen off on its own. After showering or bathing, gently pat the wound dry with a clean towel. °· Do not apply liquid medicine, cream medicine, or ointment medicine to your wound while the skin adhesive is in place. This may loosen the film before your wound is healed. °· If a dressing is placed over the wound, be careful not to apply tape directly over the skin adhesive. This may cause the adhesive to be pulled off before the wound is healed. °· Avoid prolonged exposure to sunlight or tanning lamps while the skin adhesive is in place. Exposure to ultraviolet light in the first year will darken the scar. °· The skin adhesive will usually remain in place for 5 to 10 days, then naturally fall off the skin. Do not pick at the adhesive film. °You may need a tetanus shot if: °· You cannot remember when you had your last tetanus shot. °· You have never had a tetanus   shot. °If you get a tetanus shot, your arm may swell, get red, and feel warm to the touch. This is common and not a problem. If you need a tetanus shot and you choose not to have one, there is a rare chance of getting tetanus. Sickness from tetanus can be serious. °SEEK MEDICAL CARE IF:  °· You have redness, swelling, or increasing pain in the wound. °· You see a red line that goes away from the wound. °· You have yellowish-white fluid (pus) coming from the wound. °· You have a fever. °· You notice a bad smell coming from the wound or dressing. °· Your wound breaks open before or  after sutures have been removed. °· You notice something coming out of the wound such as wood or glass. °· Your wound is on your hand or foot and you cannot move a finger or toe. °SEEK IMMEDIATE MEDICAL CARE IF:  °· Your pain is not controlled with prescribed medicine. °· You have severe swelling around the wound causing pain and numbness or a change in color in your arm, hand, leg, or foot. °· Your wound splits open and starts bleeding. °· You have worsening numbness, weakness, or loss of function of any joint around or beyond the wound. °· You develop painful lumps near the wound or on the skin anywhere on your body. °MAKE SURE YOU:  °· Understand these instructions. °· Will watch your condition. °· Will get help right away if you are not doing well or get worse. °Document Released: 04/18/2005 Document Revised: 07/11/2011 Document Reviewed: 10/12/2010 °Exit[employer]® Patient Information ©2015 [employer]. This information is not intended to replace advice given to you by your health care provider. Make sure you discuss any questions you have with your health care provider. °Fall Prevention and Home Safety °Falls cause injuries and can affect all age groups. It is possible to use preventive measures to significantly decrease the likelihood of falls. There are many simple measures which can make your home safer and prevent falls. °OUTDOORS °· Repair cracks and edges of walkways and driveways. °· Remove high doorway thresholds. °· Trim shrubbery on the main path into your home. °· Have good outside lighting. °· Clear walkways of tools, rocks, debris, and clutter. °· Check that handrails are not broken and are securely fastened. Both sides of steps should have handrails. °· Have leaves, snow, and ice cleared regularly. °· Use sand or salt on walkways during winter months. °· In the garage, clean up grease or oil spills. °BATHROOM °· Install night lights. °· Install grab bars by the toilet and in the tub and  shower. °· Use non-skid mats or decals in the tub or shower. °· Place a plastic non-slip stool in the shower to sit on, if needed. °· Keep floors dry and clean up all water on the floor immediately. °· Remove soap buildup in the tub or shower on a regular basis. °· Secure bath mats with non-slip, double-sided rug tape. °· Remove throw rugs and tripping hazards from the floors. °BEDROOMS °· Install night lights. °· Make sure a bedside light is easy to reach. °· Do not use oversized bedding. °· Keep a telephone by your bedside. °· Have a firm chair with side arms to use for getting dressed. °· Remove throw rugs and tripping hazards from the floor. °KITCHEN °· Keep handles on pots and pans turned toward the center of the stove. Use back burners when possible. °· Clean up spills quickly and allow time   for drying.  Avoid walking on wet floors.  Avoid hot utensils and knives.  Position shelves so they are not too high or low.  Place commonly used objects within easy reach.  If necessary, use a sturdy step stool with a grab bar when reaching.  Keep electrical cables out of the way.  Do not use floor polish or wax that makes floors slippery. If you must use wax, use non-skid floor wax.  Remove throw rugs and tripping hazards from the floor. STAIRWAYS  Never leave objects on stairs.  Place handrails on both sides of stairways and use them. Fix any loose handrails. Make sure handrails on both sides of the stairways are as long as the stairs.  Check carpeting to make sure it is firmly attached along stairs. Make repairs to worn or loose carpet promptly.  Avoid placing throw rugs at the top or bottom of stairways, or properly secure the rug with carpet tape to prevent slippage. Get rid of throw rugs, if possible.  Have an electrician put in a light switch at the top and bottom of the stairs. OTHER FALL PREVENTION TIPS  Wear low-heel or rubber-soled shoes that are supportive and fit well. Wear  closed toe shoes.  When using a stepladder, make sure it is fully opened and both spreaders are firmly locked. Do not climb a closed stepladder.  Add color or contrast paint or tape to grab bars and handrails in your home. Place contrasting color strips on first and last steps.  Learn and use mobility aids as needed. Install an electrical emergency response system.  Turn on lights to avoid dark areas. Replace light bulbs that burn out immediately. Get light switches that glow.  Arrange furniture to create clear pathways. Keep furniture in the same place.  Firmly attach carpet with non-skid or double-sided tape.  Eliminate uneven floor surfaces.  Select a carpet pattern that does not visually hide the edge of steps.  Be aware of all pets. OTHER HOME SAFETY TIPS  Set the water temperature for 120 F (48.8 C).  Keep emergency numbers on or near the telephone.  Keep smoke detectors on every level of the home and near sleeping areas. Document Released: 04/08/2002 Document Revised: 10/18/2011 Document Reviewed: 07/08/2011 [facility] Patient Information 2015 [place], [place]. This information is not intended to replace advice given to you by your health care provider. Make sure you discuss any questions you have with your health care provider. Muscle Strain A muscle strain is an injury that occurs when a muscle is stretched beyond its normal length. Usually a small number of muscle fibers are torn when this happens. Muscle strain is rated in degrees. First-degree strains have the least amount of muscle fiber tearing and pain. Second-degree and third-degree strains have increasingly more tearing and pain.  Usually, recovery from muscle strain takes 1-2 weeks. Complete healing takes 5-6 weeks.  CAUSES  Muscle strain happens when a sudden, violent force placed on a muscle stretches it too far. This may occur with lifting, sports, or a fall.  RISK FACTORS Muscle strain is especially common in  athletes.  SIGNS AND SYMPTOMS At the site of the muscle strain, there may be:  Pain.  Bruising.  Swelling.  Difficulty using the muscle due to pain or lack of normal function. DIAGNOSIS  Your health care provider will perform a physical exam and ask about your medical history. TREATMENT  Often, the best treatment for a muscle strain is resting, icing, and applying cold compresses  to the injured area.  HOME CARE INSTRUCTIONS   Use the PRICE method of treatment to promote muscle healing during the first 2-3 days after your injury. The PRICE method involves:  Protecting the muscle from being injured again.  Restricting your activity and resting the injured body part.  Icing your injury. To do this, put ice in a plastic bag. Place a towel between your skin and the bag. Then, apply the ice and leave it on from 15-20 minutes each hour. After the third day, switch to moist heat packs.  Apply compression to the injured area with a splint or elastic bandage. Be careful not to wrap it too tightly. This may interfere with blood circulation or increase swelling.  Elevate the injured body part above the level of your heart as often as you can.  Only take over-the-counter or prescription medicines for pain, discomfort, or fever as directed by your health care provider.  Warming up prior to exercise helps to prevent future muscle strains. SEEK MEDICAL CARE IF:   You have increasing pain or swelling in the injured area.  You have numbness, tingling, or a significant loss of strength in the injured area. MAKE SURE YOU:   Understand these instructions.  Will watch your condition.  Will get help right away if you are not doing well or get worse. Document Released: 04/18/2005 Document Revised: 02/06/2013 Document Reviewed: 11/15/2012 [facility] Patient Information 2015 [place], [place]. This information is not intended to replace advice given to you by your health care provider. Make sure you  discuss any questions you have with your health care provider.

## 2014-08-20 ENCOUNTER — Encounter: Payer: Self-pay | Admitting: Internal Medicine

## 2014-08-20 ENCOUNTER — Encounter: Payer: Self-pay | Admitting: Podiatrist

## 2014-08-20 ENCOUNTER — Ambulatory Visit (INDEPENDENT_AMBULATORY_CARE_PROVIDER_SITE_OTHER): Payer: Medicare Other | Admitting: Podiatrist

## 2014-08-20 DIAGNOSIS — E1140 Type 2 diabetes mellitus with diabetic neuropathy, unspecified: Secondary | ICD-10-CM | POA: Diagnosis not present

## 2014-08-20 DIAGNOSIS — M2020 Hallux rigidus, unspecified foot: Secondary | ICD-10-CM

## 2014-08-20 NOTE — Progress Notes (Signed)
  Subjective: Patient presents today for diabetic shoe measurements. She's been seeing Dr. Freida Busmanalton bathea or her right foot neuropathy and states that she just put her on tramadol which has been helping with the pain.  Objective: Neuropathy noted bilateral. 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 Shortened healed 2 to an old calcaneal fracture right is also noted. Hyperkeratotic lesion sub-metatarsal was also seen. Vascular status intact DP and PT at 2/4 bilateral.  Assessment: Diabetes with neuropathy, hallux limitus rigidus, old calcaneal fracture right  Plan: Forms are filled out for the shoes and we did go ahead and measured her at today's visit. Will call when he is ready for pickup.

## 2014-08-20 NOTE — Patient Instructions (Signed)
DIABETIC SHOES-  Diabetic shoes and accomidative inserts are indicated for those persons who have Diabetes and who are at an increased for a foot ulceration.  This requires patients to have decreased feeling in the feet, circulation problems, as well as an abnormal foot structure, or a combination of these disorders.  As podiatrists, we can recommend a diabetic shoe to your primary care doctor or endocrinologist HOWEVER, it is up to them to decide if you are eligible or in need of these shoes.  Before we can take measurements or do the ordering of a diabetic shoe and accomidative insert, it is required that we get a signed authorization from your physician both confirming you have Diabetes and that you are at risk for an ulceration.  Please note, if you physician will not sign for the shoes, there is no way we can go around him or her.  You are welcome to place an order for the shoes, however the financial responsibility is up to you and your insurance cannot be billed.   Once, approval is granted for the diabetic shoes and inserts we will make you a separate appointment to be measured, casted, and for you to choose the diabetic shoe.  We will then call you when they are ready for dispensing and you will be seen to make sure the shoes and inserts fit your foot properly.  As this is a multi-step process, it generally takes 4-8 weeks from start to finish.  You cooperation is appreciated.  If it has been more than 6 weeks from the date you ordered your diabetic shoe and you have not heard from our office, please give us a call.     

## 2014-10-15 ENCOUNTER — Ambulatory Visit: Payer: Medicare Other | Admitting: *Deleted

## 2014-10-15 DIAGNOSIS — E1140 Type 2 diabetes mellitus with diabetic neuropathy, unspecified: Secondary | ICD-10-CM

## 2014-10-15 NOTE — Progress Notes (Signed)
Patient ID: [name], female   DOB: 1955/12/19, 59 y.o.   MRN: [id number] BEING MEASURED AND PICKING OUT SHOES

## 2014-11-10 ENCOUNTER — Ambulatory Visit: Payer: Medicare Other

## 2014-11-26 ENCOUNTER — Other Ambulatory Visit: Payer: Medicare Other

## 2014-12-09 ENCOUNTER — Ambulatory Visit (INDEPENDENT_AMBULATORY_CARE_PROVIDER_SITE_OTHER): Payer: Medicare Other | Admitting: Podiatry

## 2014-12-09 DIAGNOSIS — E1140 Type 2 diabetes mellitus with diabetic neuropathy, unspecified: Secondary | ICD-10-CM | POA: Diagnosis not present

## 2014-12-09 DIAGNOSIS — L84 Corns and callosities: Secondary | ICD-10-CM | POA: Diagnosis not present

## 2014-12-09 DIAGNOSIS — M2022 Hallux rigidus, left foot: Secondary | ICD-10-CM

## 2014-12-09 DIAGNOSIS — M2021 Hallux rigidus, right foot: Secondary | ICD-10-CM | POA: Diagnosis not present

## 2014-12-09 DIAGNOSIS — M216X1 Other acquired deformities of right foot: Secondary | ICD-10-CM

## 2014-12-09 NOTE — Patient Instructions (Signed)

## 2014-12-12 NOTE — Progress Notes (Signed)
Patient ID: [name], female   DOB: 05/11/1955, 59 y.o.   MRN: [id number] Patient presents for diabetic shoe pick up, shoes are tried on for good fit.  Patient received 1 Pair Apex A201W Petals Evelyn loafer brown in women's 8.5 wide and 3 pairs custom molded diabetic inserts.  Verbal and written break in and wear instructions given.  Patient will follow up for scheduled routine care.

## 2015-05-01 ENCOUNTER — Encounter: Payer: Self-pay | Admitting: Internal Medicine

## 2015-07-15 ENCOUNTER — Encounter: Payer: Self-pay | Admitting: Internal Medicine

## 2016-12-23 ENCOUNTER — Encounter (INDEPENDENT_AMBULATORY_CARE_PROVIDER_SITE_OTHER): Payer: Self-pay | Admitting: Family

## 2016-12-23 ENCOUNTER — Ambulatory Visit (INDEPENDENT_AMBULATORY_CARE_PROVIDER_SITE_OTHER): Payer: Medicare Other | Admitting: Family

## 2016-12-23 VITALS — Ht 67.0 in | Wt 265.0 lb

## 2016-12-23 DIAGNOSIS — L819 Disorder of pigmentation, unspecified: Secondary | ICD-10-CM | POA: Diagnosis not present

## 2016-12-23 DIAGNOSIS — R0989 Other specified symptoms and signs involving the circulatory and respiratory systems: Secondary | ICD-10-CM | POA: Diagnosis not present

## 2016-12-23 NOTE — Progress Notes (Signed)
Office Visit Note   Patient: [name]           Date of Birth: Dec 16, 1955           MRN: [id number] Visit Date: 12/23/2016              Requested by: Catha Gosselin, MD [address] [place], [place] [address] PCP: Catha Gosselin, MD  Chief Complaint  Patient presents with  . Left Foot - Numbness  . Right Foot - Numbness      HPI: The patient is a 61 year old woman who presents today complaining of bilateral discoloration to her second through fifth toes. States that intermittently these will turn purple typically occurs at the end of the day is not associated with swelling. She states there is no pain associated with this she has no symptoms of claudication. Is not on blood thinner. Did see her primary care for the same who was concerned for diminished blood flow to her right foot.   Does have a history of a calcaneal fracture on the right eventually underwent excision of partial calcaneus, is disabled, ambulates with a cane.  Assessment & Plan: Visit Diagnoses:  1. Discoloration of skin of toe   2. Diminished pulses in lower extremity     Plan: Will set her up for ABIs of lower extremities. Follow up in office to discuss results.   Follow-Up Instructions: Return in about 4 weeks (around 01/20/2017).   Ortho Exam  Patient is alert, oriented, no adenopathy, well-dressed, normal affect, normal respiratory effort. On examination of bilateral feet, today are plantigrade. Are nomothermic. Color adequate. No lesions or open areas. No erythema. Left DP and PT pulses are strong, palpable. Right foot unable to palpate DP.   With doppler biphasic pulse heard.   Imaging: No results found. No images are attached to the encounter.  Labs: Lab Results  Component Value Date   HGBA1C 6.1 (H) 12/29/2011   REPTSTATUS 03/01/2008 FINAL 02/23/2008   CULT NO GROWTH 5 DAYS 02/23/2008    Orders:  No orders of the defined types were placed in this encounter.  No orders of the  defined types were placed in this encounter.    Procedures: No procedures performed  Clinical Data: No additional findings.  ROS:  All other systems negative, except as noted in the HPI. Review of Systems  Constitutional: Negative for chills and fever.  Cardiovascular: Negative for leg swelling.  Musculoskeletal: Negative for arthralgias, joint swelling and myalgias.  Skin: Positive for color change. Negative for pallor and wound.  Neurological: Negative for weakness and numbness.    Objective: Vital Signs: Ht 5\' 7"  (1.702 m)   Wt 265 lb (120.2 kg)   BMI 41.50 kg/m   Specialty Comments:  No specialty comments available.  PMFS History: Patient Active Problem List   Diagnosis Date Noted  . Leucocytosis 03/13/2012  . Depression with suicidal ideation 02/18/2012  . Morbid obesity (HCC) 07/14/2011   Past Medical History:  Diagnosis Date  . Anemia    hx of   . Contact lens/glasses fitting   . Depression   . Diabetes mellitus   . Fatigue    due to loss of sleep  . Fracture of right heel 2006  . GERD (gastroesophageal reflux disease)   . 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 Herpes   . Hx of blood clots   . Mitral valve prolapse    asymptomatic   . Morbid  obesity (HCC)   . 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 Night sweats   . PONV (postoperative nausea and vomiting)   . Pulmonary embolism (HCC) 2009  . Suicidal ideation   . Weight gain     Family History  Problem Relation Age of Onset  . Adopted: Yes    Past Surgical History:  Procedure Laterality Date  . ABDOMINAL HYSTERECTOMY  2003  . BREATH TEK H PYLORI  01/09/2012   Procedure: BREATH TEK H PYLORI;  Surgeon: Kandis Cocking, MD;  Location: [facility];  Service: General;  Laterality: N/A;  . EYE SURGERY  03/2001   lasik  . HYSTEROSCOPY  2002  . LAPAROSCOPY  2002  . pseudotumor cerebri    . right heel fracture      surgery due to trauma- 2006    Social  History   Occupational History  . Not on file.   Social History Main Topics  . Smoking status: Never Smoker  . Smokeless tobacco: Never Used  . Alcohol use No  . Drug use: No  . Sexual activity: Not Currently    Birth control/ protection: Post-menopausal

## 2016-12-27 ENCOUNTER — Ambulatory Visit (HOSPITAL_COMMUNITY)
Admission: RE | Admit: 2016-12-27 | Discharge: 2016-12-27 | Disposition: A | Discharge status: Home / Self Care | Payer: Medicare Other | Source: Ambulatory Visit | Attending: Vascular Surgery | Admitting: Vascular Surgery

## 2016-12-27 DIAGNOSIS — L819 Disorder of pigmentation, unspecified: Secondary | ICD-10-CM

## 2016-12-27 DIAGNOSIS — I743 Embolism and thrombosis of arteries of the lower extremities: Secondary | ICD-10-CM | POA: Diagnosis not present

## 2017-01-04 ENCOUNTER — Telehealth (INDEPENDENT_AMBULATORY_CARE_PROVIDER_SITE_OTHER): Payer: Self-pay | Admitting: Radiology

## 2017-01-04 NOTE — Telephone Encounter (Signed)
Can you advise what to tell patient?  Or does she need to discuss at a follow up appt?

## 2017-01-04 NOTE — Telephone Encounter (Signed)
Patient is calling to get her results form [facility] from last Wednesday.

## 2017-01-06 NOTE — Telephone Encounter (Signed)
I called and left voicemail for message below. 

## 2017-01-09 ENCOUNTER — Telehealth (INDEPENDENT_AMBULATORY_CARE_PROVIDER_SITE_OTHER): Payer: Self-pay | Admitting: Orthopedic Surgery

## 2017-01-09 NOTE — Telephone Encounter (Signed)
Patient called wanting to know if she could get her results again, she received your voicemail but wanted to ask you some questions. CB # [phone]

## 2017-01-10 NOTE — Telephone Encounter (Signed)
Good control of blood pressure and cholesterol will minimize risk of vascular disease. Weight loss and exercise are also extremely important.

## 2017-01-10 NOTE — Telephone Encounter (Signed)
I called and spoke with patient advising of message below.

## 2017-01-24 ENCOUNTER — Telehealth (INDEPENDENT_AMBULATORY_CARE_PROVIDER_SITE_OTHER): Payer: Self-pay | Admitting: Orthopedic Surgery

## 2017-01-24 NOTE — Telephone Encounter (Signed)
Mailed patient medical records release form, per her request. 

## 2017-02-06 ENCOUNTER — Telehealth (INDEPENDENT_AMBULATORY_CARE_PROVIDER_SITE_OTHER): Payer: Self-pay | Admitting: Orthopedic Surgery

## 2017-02-06 NOTE — Telephone Encounter (Signed)
Mailed copy of 12/28/2016 [facility] report to patient, per her request

## 2017-05-08 ENCOUNTER — Telehealth (INDEPENDENT_AMBULATORY_CARE_PROVIDER_SITE_OTHER): Payer: Self-pay | Admitting: Orthopedic Surgery

## 2017-05-08 NOTE — Telephone Encounter (Signed)
Returned call to patient left message for a call back  [phone]

## 2017-05-11 ENCOUNTER — Ambulatory Visit (INDEPENDENT_AMBULATORY_CARE_PROVIDER_SITE_OTHER): Payer: Medicare Other | Admitting: Orthopedic Surgery

## 2017-05-11 ENCOUNTER — Encounter (INDEPENDENT_AMBULATORY_CARE_PROVIDER_SITE_OTHER): Payer: Self-pay | Admitting: Orthopedic Surgery

## 2017-05-11 DIAGNOSIS — E1142 Type 2 diabetes mellitus with diabetic polyneuropathy: Secondary | ICD-10-CM

## 2017-05-11 DIAGNOSIS — M79671 Pain in right foot: Secondary | ICD-10-CM | POA: Diagnosis not present

## 2017-05-11 DIAGNOSIS — M6702 Short Achilles tendon (acquired), left ankle: Secondary | ICD-10-CM | POA: Diagnosis not present

## 2017-05-11 DIAGNOSIS — M6701 Short Achilles tendon (acquired), right ankle: Secondary | ICD-10-CM | POA: Diagnosis not present

## 2017-05-11 DIAGNOSIS — M79672 Pain in left foot: Secondary | ICD-10-CM | POA: Diagnosis not present

## 2017-05-11 NOTE — Progress Notes (Signed)
Office Visit Note   Patient: [name]           Date of Birth: [birth year]           MRN: [id number] Visit Date: 05/11/2017              Requested by: Catha Gosselin, MD [address] [place], [place] [address] PCP: Catha Gosselin, MD  Chief Complaint  Patient presents with  . Left Foot - Pain  . Right Foot - Pain      HPI: Patient is a 62 year old woman with type 2 diabetes.  Patient has been on oral medication.  Patient states he has had neuropathic pain in the metatarsal heads of both feet she has taken doxepin and Neurontin very high doses and still has burning pain she states that she has had this for years.  She states that her foot on the right was blue she went to vascular vein surgery and was told her circulation was good.  She has neuropathic pain in the forefoot bilaterally.  Assessment & Plan: Visit Diagnoses:  1. Achilles tendon contracture, bilateral   2. Diabetic polyneuropathy associated with type 2 diabetes mellitus (HCC)   3. Foot pain, bilateral     Plan: Patient was given instructions and demonstrated heel cord stretching bilaterally to do 5 times a day a minute at a time.  Recommended a new balance walking sneaker versus a [place] running shoe.  Discussed that if she does not resolve the Achilles contracture with stretching that we could proceed with a gastrocnemius recession.  Follow-Up Instructions: Return if symptoms worsen or fail to improve.   Ortho Exam  Patient is alert, oriented, no adenopathy, well-dressed, normal affect, normal respiratory effort. Examination patient has a normal gait she has a good dorsalis pedis pulse bilaterally.  She has venous stasis swelling in both legs but no ulcers.  She has significant heel cord contracture with dorsiflexion short of neutral with her knee extended.  She has no plantar ulcers she does have callus across the forefoot from increased pressure.  Patient states she primarily walks in bedroom slippers  or barefoot feet and with her heel cord contracture is overloading the forefoot.  Imaging: No results found. No images are attached to the encounter.  Labs: Lab Results  Component Value Date   HGBA1C 6.1 (H) 12/29/2011   REPTSTATUS 03/01/2008 FINAL 02/23/2008   CULT NO GROWTH 5 DAYS 02/23/2008    @LABSALLVALUES (HGBA1)@  There is no height or weight on file to calculate BMI.  Orders:  No orders of the defined types were placed in this encounter.  No orders of the defined types were placed in this encounter.    Procedures: No procedures performed  Clinical Data: No additional findings.  ROS:  All other systems negative, except as noted in the HPI. Review of Systems  Objective: Vital Signs: There were no vitals taken for this visit.  Specialty Comments:  No specialty comments available.  PMFS History: Patient Active Problem List   Diagnosis Date Noted  . Leucocytosis 03/13/2012  . Depression with suicidal ideation 02/18/2012  . Morbid obesity (HCC) 07/14/2011   Past Medical History:  Diagnosis Date  . Anemia    hx of   . Contact lens/glasses fitting   . Depression   . Diabetes mellitus   . Fatigue    due to loss of sleep  . Fracture of right heel 2006  . GERD (gastroesophageal reflux disease)   . 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 Herpes   . Hx of blood clots   . Mitral valve prolapse    asymptomatic   . Morbid obesity (HCC)   . 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 Night sweats   . PONV (postoperative nausea and vomiting)   . Pulmonary embolism (HCC) 2009  . Suicidal ideation   . Weight gain     Family History  Adopted: Yes    Past Surgical History:  Procedure Laterality Date  . ABDOMINAL HYSTERECTOMY  2003  . BREATH TEK H PYLORI  01/09/2012   Procedure: BREATH TEK H PYLORI;  Surgeon: Kandis Cockingavid H Newman, MD;  Location: [facility];  Service: General;  Laterality: N/A;  . EYE SURGERY  03/2001    lasik  . HYSTEROSCOPY  2002  . LAPAROSCOPY  2002  . pseudotumor cerebri    . right heel fracture      surgery due to trauma- 2006    Social History   Occupational History  . Not on file  Tobacco Use  . Smoking status: Never Smoker  . Smokeless tobacco: Never Used  Substance and Sexual Activity  . Alcohol use: No  . Drug use: No  . Sexual activity: Not Currently    Birth control/protection: Post-menopausal

## 2017-05-23 ENCOUNTER — Encounter: Payer: Self-pay | Admitting: Podiatry

## 2017-05-23 ENCOUNTER — Ambulatory Visit: Payer: Medicare Other | Admitting: Podiatry

## 2017-05-23 DIAGNOSIS — E1140 Type 2 diabetes mellitus with diabetic neuropathy, unspecified: Secondary | ICD-10-CM

## 2017-05-23 DIAGNOSIS — M2041 Other hammer toe(s) (acquired), right foot: Secondary | ICD-10-CM

## 2017-05-23 DIAGNOSIS — M2042 Other hammer toe(s) (acquired), left foot: Secondary | ICD-10-CM

## 2017-05-23 DIAGNOSIS — E1149 Type 2 diabetes mellitus with other diabetic neurological complication: Secondary | ICD-10-CM | POA: Diagnosis not present

## 2017-05-24 NOTE — Progress Notes (Signed)
She presents today with a chief complaint of pain sharp pain in her toes.  She states that her doctor put her on multiple medications including supplements which has helped some she states that her last A1c is a 5.5 she is seen Dr. due to who states that she needs to have more surgery in the back of her leg to help with the numbness in the forefoot right but she has similar numbness in the left foot.  States nighttime is particularly bad she would like to see by getting some diabetic shoes.  Objective: Vital signs are stable alert and oriented x3.  Pulses are palpable.  Capillary fill time slightly diminished neurologic sensorium is slightly diminished per Semmes Weinstein monofilament distally only.  Deep tendon reflexes are not elicitable muscle strength is normal bilateral.  Orthopedic evaluation demonstrates all joints distal to the ankle full range of motion without crepitation.  She does have pain on palpation of the calcaneus where she has had a fracture heel on the right side of 4 she does have significant hammertoe deformity mallet toe deformity to the right side.  Cutaneous evaluation demonstrates supple well-hydrated cutis no erythema edema cellulitis drainage or odor.  No open lesions or wounds.  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  Hammertoe deformity mallet toe deformity right side.  Plan: Discussed etiology pathology conservative versus surgical therapies.  At this point I confirmed her suspicion that primarily the pain is from diabetic peripheral neuropathy.  We will go ahead and get her scheduled to have diabetic shoes made.

## 2017-05-30 ENCOUNTER — Ambulatory Visit: Payer: Medicare Other | Admitting: Orthotics

## 2017-05-30 DIAGNOSIS — E1149 Type 2 diabetes mellitus with other diabetic neurological complication: Principal | ICD-10-CM

## 2017-05-30 DIAGNOSIS — M2042 Other hammer toe(s) (acquired), left foot: Secondary | ICD-10-CM

## 2017-05-30 DIAGNOSIS — M2041 Other hammer toe(s) (acquired), right foot: Secondary | ICD-10-CM

## 2017-05-30 DIAGNOSIS — E1140 Type 2 diabetes mellitus with diabetic neuropathy, unspecified: Secondary | ICD-10-CM

## 2017-05-30 NOTE — Progress Notes (Signed)
Patient seen today for evaluation, measurement, selection for diabetic shoes.  She presents with DM2, PN, and HT (right more so than left: mallet)' measured 8.5 Wide on [place] device L, but 7 on R duet to mallet toes.   Patient chose apex [id number]

## 2017-06-02 ENCOUNTER — Telehealth: Payer: Self-pay | Admitting: Podiatry

## 2017-06-02 NOTE — Telephone Encounter (Signed)
Left message for pt to call me back regarding her diabetic shoes. The paperwork that Dr Clarene DukeLittle signed states last visit was 2017 and pt has to be seen with 6 months.

## 2017-06-20 DIAGNOSIS — H04123 Dry eye syndrome of bilateral lacrimal glands: Secondary | ICD-10-CM | POA: Insufficient documentation

## 2017-06-20 DIAGNOSIS — H0288B Meibomian gland dysfunction left eye, upper and lower eyelids: Secondary | ICD-10-CM

## 2017-06-20 DIAGNOSIS — H0288A Meibomian gland dysfunction right eye, upper and lower eyelids: Secondary | ICD-10-CM | POA: Insufficient documentation

## 2017-06-27 ENCOUNTER — Other Ambulatory Visit: Payer: Medicare Other | Admitting: Orthotics

## 2017-06-28 ENCOUNTER — Other Ambulatory Visit: Payer: Medicare Other | Admitting: Orthotics

## 2017-08-07 ENCOUNTER — Ambulatory Visit (INDEPENDENT_AMBULATORY_CARE_PROVIDER_SITE_OTHER): Payer: Medicare Other | Admitting: Orthotics

## 2017-08-07 DIAGNOSIS — E1140 Type 2 diabetes mellitus with diabetic neuropathy, unspecified: Secondary | ICD-10-CM | POA: Diagnosis not present

## 2017-08-07 DIAGNOSIS — E1149 Type 2 diabetes mellitus with other diabetic neurological complication: Secondary | ICD-10-CM

## 2017-08-07 DIAGNOSIS — M2041 Other hammer toe(s) (acquired), right foot: Secondary | ICD-10-CM | POA: Diagnosis not present

## 2017-08-07 DIAGNOSIS — M2042 Other hammer toe(s) (acquired), left foot: Secondary | ICD-10-CM

## 2017-08-07 NOTE — Progress Notes (Signed)

## 2017-08-21 ENCOUNTER — Ambulatory Visit: Payer: Medicare Other | Admitting: Orthotics

## 2017-08-21 DIAGNOSIS — M2042 Other hammer toe(s) (acquired), left foot: Secondary | ICD-10-CM

## 2017-08-21 DIAGNOSIS — E1149 Type 2 diabetes mellitus with other diabetic neurological complication: Principal | ICD-10-CM

## 2017-08-21 DIAGNOSIS — E1140 Type 2 diabetes mellitus with diabetic neuropathy, unspecified: Secondary | ICD-10-CM

## 2017-08-21 DIAGNOSIS — M2041 Other hammer toe(s) (acquired), right foot: Secondary | ICD-10-CM

## 2017-08-21 NOTE — Progress Notes (Signed)
Shoe was slipping (l) and added tongue pad and thinned down rear counter of insert.

## 2017-10-10 ENCOUNTER — Other Ambulatory Visit: Payer: Self-pay

## 2017-10-10 ENCOUNTER — Ambulatory Visit: Payer: Medicare Other | Admitting: Podiatry

## 2017-10-10 ENCOUNTER — Encounter: Payer: Self-pay | Admitting: Podiatry

## 2017-10-10 DIAGNOSIS — M7672 Peroneal tendinitis, left leg: Secondary | ICD-10-CM | POA: Diagnosis not present

## 2017-10-10 NOTE — Progress Notes (Signed)
She presents today with severe pain along the peroneal margin left foot.  She states that I am still continue to take 800 mg of gabapentin 3 times a day and that works for the most part but for the last few nights is really been killing me right in here as she relates radiating pain from just inferior lateral malleolus to the fifth toe.  Objective: Vital signs are stable she is alert and oriented x3.  She has pain on abduction against resistance regarding the [place] us brevis tendon.  She also has tenderness on palpation of the sural nerve.  Assessment: Cervical nerve neuritis or peroneal tendinitis.  Plan: Injected 20 mg Kenalog 5 mg Marcaine after sterile Betadine skin prep to the area.  Tolerated procedure well noticed immediate relief.  Follow-up with her in the near future if necessary.

## 2017-10-12 ENCOUNTER — Ambulatory Visit: Payer: Medicare Other | Admitting: Podiatry

## 2017-10-12 ENCOUNTER — Telehealth: Payer: Self-pay | Admitting: *Deleted

## 2017-10-12 MED ORDER — TRAMADOL HCL 50 MG PO TABS: ORAL_TABLET | ORAL | 0 refills | Status: DC

## 2017-10-12 NOTE — Telephone Encounter (Signed)
Dr. Al CorpusHyatt ordered Tramadol 50mg  #15 one tablet every 8 hours prn foot pain. Left message informing pt continue the ibupofen and tylenol as instructed by Dr. Al CorpusHyatt and to ice 3-4 times day for 15-20 minutes/sessions protecting skin from the ice with a little cloth, and [phone]

## 2017-10-12 NOTE — Telephone Encounter (Signed)
Pt states she received an injection in her left foot yesterday and it helped until this morning, now throbbing and is taking the ibuprofen and tylenol as instructed by Dr. Al CorpusHyatt.

## 2017-10-13 ENCOUNTER — Encounter: Payer: Self-pay | Admitting: Podiatry

## 2017-12-15 ENCOUNTER — Ambulatory Visit: Payer: Medicare Other | Admitting: Podiatry

## 2017-12-15 ENCOUNTER — Encounter: Payer: Self-pay | Admitting: Podiatry

## 2017-12-15 DIAGNOSIS — M7672 Peroneal tendinitis, left leg: Secondary | ICD-10-CM | POA: Diagnosis not present

## 2017-12-15 DIAGNOSIS — M779 Enthesopathy, unspecified: Secondary | ICD-10-CM

## 2017-12-15 DIAGNOSIS — G5782 Other specified mononeuropathies of left lower limb: Secondary | ICD-10-CM

## 2017-12-15 DIAGNOSIS — M7752 Other enthesopathy of left foot: Secondary | ICD-10-CM

## 2017-12-15 NOTE — Progress Notes (Signed)
Subjective:  Patient ID: MEARA WIECHMAN, female    DOB: 10/28/1955,  MRN: [id number]  Chief Complaint  Patient presents with  . Foot Pain    Left foot; lateral side; pt stated, "I am having a flare-up and would like to get an injection today"    62 y.o. female presents with the above complaint.  Reports pain to the outside of the left foot.  Different from the pain she experienced her last visit.  Would like to get an injection today.  States that the injection that she received her last visit greatly helped her pain.   Review of Systems: Negative except as noted in the HPI. Denies N/V/F/Ch.  Past Medical History:  Diagnosis Date  . Anemia    hx of   . Contact lens/glasses fitting   . Depression   . Diabetes mellitus   . Fatigue    due to loss of sleep  . Fracture of right heel 2006  . GERD (gastroesophageal reflux disease)   . 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 Herpes   . Hx of blood clots   . Mitral valve prolapse    asymptomatic   . Morbid obesity ([place])   . Neuromuscular disorder ([place])    mild neuropathy in feet   . Night sweats   . PONV (postoperative nausea and vomiting)   . Pulmonary embolism ([place]) 2009  . Suicidal ideation   . Weight gain     Current Outpatient Medications:  .  acetaminophen (TYLENOL) 500 MG tablet, Take 1,000 mg by mouth every 6 (six) hours as needed. For pain., Disp: , Rfl:  .  ALPRAZolam (XANAX) 1 MG tablet, Take 1 mg by mouth every 6 (six) hours as needed. For anxiety., Disp: , Rfl:  .  amoxicillin (AMOXIL) 500 MG capsule, TK 4 CS PO 1 HOUR PRIOR TO DENTAL APPOINTMENT PROCEDURE, Disp: , Rfl: 1 .  AQUALANCE LANCETS 30G MISC, , Disp: , Rfl:  .  aspirin EC 81 MG tablet, Take 81 mg by mouth at bedtime., Disp: , Rfl:  .  Blood Glucose Calibration (OT ULTRA/FASTTK CNTRL SOLN) SOLN, , Disp: , Rfl:  .  buPROPion (WELLBUTRIN XL) 150 MG 24 hr tablet, Take 150 mg by mouth daily., Disp: , Rfl:  .  Cholecalciferol (VITAMIN  D-1000 MAX ST) 1000 units tablet, Take by mouth., Disp: , Rfl:  .  diazepam (VALIUM) 5 MG tablet, Take 1 tablet (5 mg total) by mouth 2 (two) times daily., Disp: 10 tablet, Rfl: 0 .  doxepin (SINEQUAN) 25 MG capsule, Take 75 mg by mouth at bedtime., Disp: , Rfl:  .  DULoxetine (CYMBALTA) 60 MG capsule, Take 60 mg by mouth every morning. , Disp: , Rfl:  .  fluticasone (FLONASE) 50 MCG/ACT nasal spray, Place 2 sprays into the nose daily., Disp: , Rfl:  .  gabapentin (NEURONTIN) 800 MG tablet, Take 800 mg by mouth 3 (three) times daily as needed. For neuropathy pain in feet., Disp: , Rfl:  .  glimepiride (AMARYL) 2 MG tablet, Take 2 mg by mouth at bedtime. , Disp: , Rfl:  .  Lancet Devices (SIMPLE DIAGNOSTICS LANCING DEV) MISC, , Disp: , Rfl:  .  losartan (COZAAR) 50 MG tablet, Take 50 mg by mouth every morning. , Disp: , Rfl:  .  Magnesium Carbonate (MAGNESIUM GLUCONATE) 67m/5ml syringe, Take by mouth., Disp: , Rfl:  .  mirtazapine (REMERON) 15 MG tablet, Take 15 mg by mouth  at bedtime., Disp: , Rfl:  .  Multiple Vitamin (MULTIVITAMIN WITH MINERALS) TABS, Take 1 tablet by mouth every morning., Disp: , Rfl:  .  NEOMYCIN-POLYMYXIN-HYDROCORTISONE (CORTISPORIN) 1 % SOLN OTIC solution, 4 drops in affected ear(s) 4 times daily for 7-10 days., Disp: , Rfl:  .  omeprazole (PRILOSEC) 20 MG capsule, Take 20 mg by mouth every morning. , Disp: , Rfl:  .  ONE TOUCH ULTRA TEST test strip, , Disp: , Rfl:  .  senna-docusate (STOOL SOFTENER & LAXATIVE) 8.6-50 MG per tablet, Take 4-5 tablets by mouth at bedtime., Disp: , Rfl:  .  simvastatin (ZOCOR) 20 MG tablet, Take 20 mg by mouth at bedtime. , Disp: , Rfl:  .  traMADol (ULTRAM) 50 MG tablet, Take one tablet every 8 hours prn foot pain., Disp: 15 tablet, Rfl: 0 .  traZODone (DESYREL) 100 MG tablet, Take 100-300 mg by mouth at bedtime., Disp: , Rfl:  .  zolpidem (AMBIEN) 10 MG tablet, Take 10 mg by mouth at bedtime as needed for sleep. , Disp: , Rfl:   Social  History   Tobacco Use  Smoking Status Never Smoker  Smokeless Tobacco Never Used    Allergies  Allergen Reactions  . Augmentin [Amoxicillin-Pot Clavulanate] Diarrhea  . Metformin And Related Diarrhea  . Tegretol [Carbamazepine] Nausea And Vomiting   Objective:  There were no vitals filed for this visit. There is no height or weight on file to calculate BMI. Constitutional Well developed. Well nourished.  Vascular Dorsalis pedis pulses palpable bilaterally. Posterior tibial pulses palpable bilaterally. Capillary refill normal to all digits.  No cyanosis or clubbing noted. Pedal hair growth normal.  Neurologic Normal speech. Oriented to person, place, and time. Epicritic sensation to light touch grossly present bilaterally.  Dermatologic Nails well groomed and normal in appearance. No open wounds. No skin lesions.  Orthopedic: Normal joint ROM without pain or crepitus bilaterally. No visible deformities. Pain palpation about the fifth metatarsal base left.   No pain at the sural nerve course No pain along the peroneal tendons but for peroneus brevis insertion.   Radiographs: None Assessment:   1. Enthesitis   2. Tendonitis   3. Peroneal tendinitis of left lower extremity   4. Capsulitis   5. Sural neuritis, left    Plan:  Patient was evaluated and treated and all questions answered.  Enthesitis L Foot -Injection delivered to the fifth met base of the cyst as below  Procedure: Capsule Injection Location: Left 5th met base Skin Prep: Alcohol. Injectate: 0.5 cc 1% lidocaine plain, 0.5 cc dexamethasone phosphate. Disposition: Patient tolerated procedure well. Injection site dressed with a band-aid.  Sural neuritis, peroneal tendinitis -Improving.  No pain to these areas today. -Hold off injection  Return if symptoms worsen or fail to improve.

## 2017-12-19 ENCOUNTER — Ambulatory Visit: Payer: Medicare Other | Admitting: Podiatry

## 2017-12-25 ENCOUNTER — Other Ambulatory Visit: Payer: Medicare Other | Admitting: Orthotics

## 2018-01-02 ENCOUNTER — Ambulatory Visit: Payer: Medicare Other | Admitting: Sports Medicine

## 2018-01-02 ENCOUNTER — Encounter

## 2018-04-19 ENCOUNTER — Ambulatory Visit: Payer: Medicare Other | Admitting: Podiatry

## 2018-04-19 ENCOUNTER — Encounter

## 2018-11-27 ENCOUNTER — Ambulatory Visit: Payer: Medicare Other | Admitting: Podiatry

## 2018-12-11 ENCOUNTER — Other Ambulatory Visit: Payer: Self-pay

## 2018-12-11 ENCOUNTER — Ambulatory Visit (INDEPENDENT_AMBULATORY_CARE_PROVIDER_SITE_OTHER): Payer: Medicare Other | Admitting: Podiatry

## 2018-12-11 ENCOUNTER — Encounter: Payer: Self-pay | Admitting: Podiatry

## 2018-12-11 VITALS — Temp 97.4°F

## 2018-12-11 DIAGNOSIS — M7672 Peroneal tendinitis, left leg: Secondary | ICD-10-CM | POA: Diagnosis not present

## 2018-12-11 MED ORDER — TRAMADOL HCL 50 MG PO TABS: 50.0000 mg | ORAL_TABLET | Freq: Three times a day (TID) | ORAL | 0 refills | Status: DC | PRN

## 2018-12-12 ENCOUNTER — Encounter: Payer: Self-pay | Admitting: Podiatry

## 2018-12-12 NOTE — Progress Notes (Signed)
She presents today for follow-up of her peroneal tendinitis left states that the last saw Dr. March Rummage gave me really did not work so wanted to come back to you.  States that is been worsening over the past few weeks.  Objective: Vital signs are stable alert and oriented x3.  Pulses are palpable.  Neurologic sensorium is intact.  Degenerative flexors are intact.  She has pain on abduction and eversion as the peroneal longus courses beneath the arcuate portion of the cuboid.  There is mild edema no cellulitis drainage or odor.  No open lesions or wounds.  Assessment: Peroneal longus tendinitis left.  Plan: I injected 10 mg of Kenalog 5 mg of Marcaine to the point of maximal tenderness along the arcuate portion of the cuboid and into the tendon sheath along the peroneal tendon.  I will follow-up with her on an as-needed basis.

## 2019-02-07 ENCOUNTER — Ambulatory Visit: Payer: Medicare Other | Admitting: Podiatry

## 2019-05-07 ENCOUNTER — Encounter: Payer: Self-pay | Admitting: Neurology

## 2019-05-08 ENCOUNTER — Other Ambulatory Visit: Payer: Self-pay

## 2019-05-08 ENCOUNTER — Ambulatory Visit: Payer: Medicare Other | Admitting: Neurology

## 2019-05-08 ENCOUNTER — Encounter: Payer: Self-pay | Admitting: Neurology

## 2019-05-08 VITALS — BP 152/89 | HR 99 | Temp 97.2°F | Ht 67.0 in | Wt 261.0 lb

## 2019-05-08 DIAGNOSIS — G479 Sleep disorder, unspecified: Secondary | ICD-10-CM

## 2019-05-08 DIAGNOSIS — R269 Unspecified abnormalities of gait and mobility: Secondary | ICD-10-CM

## 2019-05-08 DIAGNOSIS — Z79899 Other long term (current) drug therapy: Secondary | ICD-10-CM

## 2019-05-08 DIAGNOSIS — R42 Dizziness and giddiness: Secondary | ICD-10-CM | POA: Diagnosis not present

## 2019-05-08 DIAGNOSIS — H81399 Other peripheral vertigo, unspecified ear: Secondary | ICD-10-CM

## 2019-05-08 DIAGNOSIS — Z6841 Body Mass Index (BMI) 40.0 and over, adult: Secondary | ICD-10-CM

## 2019-05-08 NOTE — Progress Notes (Signed)
Subjective:    Patient ID: Renae FickleLaDora L Hines is a 64 y.o. female.  HPI     Huston FoleySaima Wilmina Maxham, MD, PhD [facility] [address], [address] [address] [place], [place] [id number]  Dear Dr. Clarene DukeLittle, I saw your patient, Ronnette JuniperLadora Dombek, upon your kind request in my neurologic clinic today for initial consultation of her dizziness.  The patient is accompanied by her mother today.  As you know, Ms. Cory RoughenKirkman is a 64 year old right-handed woman with an underlying complex medical history of depression, anemia, diabetes, mitral valve prolapse, history of blood clots, history of pulmonary embolism, heart murmur, reflux disease, Bell's palsy, hyperlipidemia, diabetic nephropathy, diabetic neuropathy, chronic neck pain, hypertension, chronic kidney disease, and morbid obesity with a BMI of over 40, who reports an approximately 7273-month history of feeling dizzy and off balance.  She has had several falls.  She reports that she fell 10 times yesterday.  She has not gone to the emergency room.  She lives alone.  She has 2 grown children who are not able to stay with her.  She has no call alert button.  She is widowed.  She reports a nonspecific sense of dizziness but denies any spinning sensation.  She does report lightheadedness upon standing.  She reports that she has been dehydrated before.  She tries to hydrate with water.  She denies using alcohol.  She does not smoke.  She does not drink caffeine on a daily basis.  She has been using a walker.  I reviewed your office records.  Of note, she is on multiple medications including several potentially sedating medications including Xanax, 1 mg 6 times per day, Ambien 10 mg at night, bupropion, mirtazapine, gabapentin high-dose 800 mg 3 times daily, doxepin 100 mg at bedtime.  She is followed by pain management and the doxepin as prescribed by her pain management doctor. She does not sleep well.  Epworth sleepiness score is 6 out of 24. She is not sure if she  snores.    Her Past Medical History Is Significant For: Past Medical History:  Diagnosis Date  . Anemia    hx of   . Contact lens/glasses fitting   . Depression   . Diabetes mellitus   . Fatigue    due to loss of sleep  . Fracture of right heel 2006  . GERD (gastroesophageal reflux disease)   . 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 Herpes   . Hx of blood clots   . Mitral valve prolapse    asymptomatic   . Morbid obesity (HCC)   . 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 Night sweats   . PONV (postoperative nausea and vomiting)   . Pulmonary embolism (HCC) 2009  . Suicidal ideation   . Weight gain     Her Past Surgical History Is Significant For: Past Surgical History:  Procedure Laterality Date  . ABDOMINAL HYSTERECTOMY  2003  . BREATH TEK H PYLORI  01/09/2012   Procedure: BREATH TEK H PYLORI;  Surgeon: Kandis Cockingavid H Newman, MD;  [facility];  Service: General;  Laterality: N/A;  . EYE SURGERY  03/2001   lasik  . HYSTEROSCOPY  2002  . LAPAROSCOPY  2002  . pseudotumor cerebri    . right heel fracture      surgery due to trauma- 2006     Her Family History Is Significant For: Family History  Adopted: Yes  Her Social History Is Significant For: Social History   Socioeconomic History  . Marital status: Widowed    Spouse name: Not on file  . Number of children: Not on file  . Years of education: Not on file  . Highest education level: Not on file  Occupational History  . Not on file  Tobacco Use  . Smoking status: Never Smoker  . Smokeless tobacco: Never Used  Substance and Sexual Activity  . Alcohol use: No  . Drug use: No  . Sexual activity: Not Currently    Birth control/protection: Post-menopausal  Other Topics Concern  . Not on file  Social History Narrative  . Not on file   Social Determinants of Health   Financial Resource Strain:   . Difficulty of Paying Living Expenses: Not on file  Food  Insecurity:   . Worried About Programme researcher, broadcasting/film/video in the Last Year: Not on file  . Ran Out of Food in the Last Year: Not on file  Transportation Needs:   . Lack of Transportation (Medical): Not on file  . Lack of Transportation (Non-Medical): Not on file  Physical Activity:   . Days of Exercise per Week: Not on file  . Minutes of Exercise per Session: Not on file  Stress:   . Feeling of Stress : Not on file  Social Connections:   . Frequency of Communication with Friends and Family: Not on file  . Frequency of Social Gatherings with Friends and Family: Not on file  . Attends Religious Services: Not on file  . Active Member of Clubs or Organizations: Not on file  . Attends Banker Meetings: Not on file  . Marital Status: Not on file    Her Allergies Are:  Allergies  Allergen Reactions  . Augmentin [Amoxicillin-Pot Clavulanate] Diarrhea  . Metformin And Related Diarrhea  . Tegretol [Carbamazepine] Nausea And Vomiting  :   Her Current Medications Are:  Outpatient Encounter Medications as of 05/08/2019  Medication Sig  . acetaminophen (TYLENOL) 500 MG tablet Take 1,000 mg by mouth every 6 (six) hours as needed. For pain.  Marland Kitchen ALPRAZolam (XANAX) 1 MG tablet Take 1 mg by mouth every 6 (six) hours as needed. For anxiety.  Marland Kitchen amoxicillin (AMOXIL) 500 MG capsule TK 4 CS PO 1 HOUR PRIOR TO DENTAL APPOINTMENT PROCEDURE  . AQUALANCE LANCETS 30G MISC   . aspirin EC 81 MG tablet Take 81 mg by mouth at bedtime.  . Blood Glucose Calibration (OT ULTRA/FASTTK CNTRL SOLN) SOLN   . buPROPion (WELLBUTRIN XL) 150 MG 24 hr tablet Take 150 mg by mouth daily.  . Cholecalciferol (VITAMIN D-1000 MAX ST) 1000 units tablet Take by mouth.  . doxepin (SINEQUAN) 25 MG capsule Take 75 mg by mouth at bedtime.  . gabapentin (NEURONTIN) 800 MG tablet Take 800 mg by mouth 3 (three) times daily as needed. For neuropathy pain in feet.  Demetra Shiner Devices (SIMPLE DIAGNOSTICS LANCING DEV) MISC   . losartan  (COZAAR) 100 MG tablet Take 100 mg by mouth daily.   . Magnesium 500 MG TABS Take by mouth at bedtime.  . mirtazapine (REMERON) 15 MG tablet Take 15 mg by mouth at bedtime.  . Multiple Vitamin (MULTIVITAMIN WITH MINERALS) TABS Take 1 tablet by mouth every morning.  Marland Kitchen omeprazole (PRILOSEC) 20 MG capsule Take 20 mg by mouth every morning.   . ONE TOUCH ULTRA TEST test strip   . simvastatin (ZOCOR) 20 MG tablet Take 20 mg by mouth  at bedtime.   . traMADol (ULTRAM) 50 MG tablet Take one tablet every 8 hours prn foot pain.  Marland Kitchen UNABLE TO FIND b12 drops per day  . zolpidem (AMBIEN) 10 MG tablet Take 10 mg by mouth at bedtime as needed for sleep.   . [DISCONTINUED] diazepam (VALIUM) 5 MG tablet Take 1 tablet (5 mg total) by mouth 2 (two) times daily. (Patient not taking: Reported on 05/08/2019)  . [DISCONTINUED] DULoxetine (CYMBALTA) 60 MG capsule Take 60 mg by mouth every morning.   . [DISCONTINUED] fluticasone (FLONASE) 50 MCG/ACT nasal spray Place 2 sprays into the nose daily.  . [DISCONTINUED] glimepiride (AMARYL) 2 MG tablet Take 2 mg by mouth at bedtime.   . [DISCONTINUED] Magnesium Carbonate (MAGNESIUM GLUCONATE) 54mg /59ml syringe Take by mouth.  . [DISCONTINUED] NEOMYCIN-POLYMYXIN-HYDROCORTISONE (CORTISPORIN) 1 % SOLN OTIC solution 4 drops in affected ear(s) 4 times daily for 7-10 days.  . [DISCONTINUED] senna-docusate (STOOL SOFTENER & LAXATIVE) 8.6-50 MG per tablet Take 4-5 tablets by mouth at bedtime.  . [DISCONTINUED] traMADol (ULTRAM) 50 MG tablet Take 1 tablet (50 mg total) by mouth every 8 (eight) hours as needed. (Patient not taking: Reported on 05/08/2019)  . [DISCONTINUED] traZODone (DESYREL) 100 MG tablet Take 100-300 mg by mouth at bedtime.   No facility-administered encounter medications on file as of 05/08/2019.  :   Review of Systems:  Out of a complete 14 point review of systems, all are reviewed and negative with the exception of these symptoms as listed below:  Review of Systems   Constitutional: Positive for unexpected weight change.  HENT: Negative.   Eyes:       Blurred vision  Respiratory: Negative.   Cardiovascular: Negative.   Gastrointestinal: Positive for diarrhea.  Endocrine: Negative.   Genitourinary: Negative.   Musculoskeletal: Positive for gait problem.  Skin:       Easy bruising   Neurological:       Memory loss Confusion Weakness Dizziness Difficulty swallowing   Epworth Sleepiness Scale 0= would never doze 1= slight chance of dozing 2= moderate chance of dozing 3= high chance of dozing  Sitting and reading: 2 Watching TV: 2 Sitting inactive in a public place (ex. Theater or meeting):0 As a passenger in a car for an hour without a break:0 Lying down to rest in the afternoon: 1 Sitting and talking to someone:1 Sitting quietly after lunch (no alcohol):0 In a car, while stopped in traffic:0 Total:6   Psychiatric/Behavioral: Positive for sleep disturbance.       Depression Anxiety Too much sleep Not enough sleep Decrease energy Change in appetite Disinterest in activates     Objective:  Neurological Exam  Physical Exam Physical Examination:   Vitals:   05/08/19 1015 05/08/19 1038  BP:  (!) 152/89  Pulse:  99  Temp: (!) 97.2 F (36.2 C)    General Examination: The patient is a very pleasant 64 y.o. female in no acute distress. She appears well-developed and well-nourished and well groomed. She denies any orthostatic lightheadedness, she had a orthostatic blood pressure check with sitting and standing blood pressure and pulse without any obvious drop, systolic blood pressure dropped about 8 points. She denies any vertiginous symptoms.  HEENT: Normocephalic, atraumatic, pupils are equal, round and reactive to light and accommodation. Funduscopic exam is normal with sharp disc margins noted. Extraocular tracking is good without limitation to gaze excursion or nystagmus noted. Normal smooth pursuit is noted. Hearing is  grossly intact. Face is symmetric with normal facial animation, no  dysarthria, no hypophonia, no voice tremor.  She has on airway examination moderate crouding.  Mouth is moderate to severely dry. Tongue protrudes centrally in palate elevates symmetrically.  There are no carotid bruits on auscultation.   Chest: Clear to auscultation without wheezing, rhonchi or crackles noted.  Heart: S1+S2+0, regular and normal without murmurs, rubs or gallops noted.   Abdomen: Soft, non-tender and non-distended with normal bowel sounds appreciated on auscultation.  Extremities: There is trace pitting edema in the distal lower extremities bilaterally.   Skin: Warm and dry without trophic changes noted.  Musculoskeletal: exam reveals no obvious joint deformities, tenderness or joint swelling or erythema.   Neurologically:  Mental status: The patient is awake, alert and oriented in all 4 spheres. Her immediate and remote memory, attention, language skills and fund of knowledge are appropriate. There is no evidence of aphasia, agnosia, apraxia or anomia. Speech is clear with normal prosody and enunciation. Thought process is linear. Mood is normal and affect is normal.  Cranial nerves II - XII are as described above under HEENT exam. In addition: shoulder shrug is normal with equal shoulder height noted. Motor exam: Normal bulk, strength and tone is noted. There is no drift, tremor or rebound. Romberg is Not tested secondary to safety concerns, reflexes are 1+ in the upper extremities and trace in the knees, absent in the ankles. Babinski: Toes are flexor bilaterally. Fine motor skills and coordination: globally mildly impaired.  Cerebellar testing: No dysmetria or intention tremor on finger to nose testing. Heel to shin is difficult for her. There is no truncal or gait ataxia.  Sensory exam: intact to light touch, vibration, temperature sense and decreased sensation to vibration and temperature sense in the right  leg distally. Gait, station and balance: She stands With difficulty and pushes herself up.  She uses a rolling walker.  She has no shuffling but Walks slowly and cautiously.               Assessment and Plan:   In summary, Doninique L Haseley is a very pleasant 64 y.o.-year old female with an underlying complex medical history of depression, anemia, diabetes, mitral valve prolapse, history of blood clots, history of pulmonary embolism, heart murmur, reflux disease, Bell's palsy, hyperlipidemia, diabetic nephropathy, diabetic neuropathy, chronic neck pain, hypertension, chronic kidney disease, and morbid obesity with a BMI of over 44, who Presents for evaluation of her dizzy spells and gait disorder.  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  She is advised to talk to you and her other prescribers about streamlining her medication regimen. In addition, she does not hydrate very well with water and has been told that she is dehydrated.  She is encouraged to change positions slowly, use her walker at all times, hydrate well with water.She does not sleep well and sleep deprivation may also be a contributor.  She is at risk for obstructive sleep apnea based on her history and examination.  She is advised to proceed with a sleep study.  In addition, I suggested we proceed with a brain MRI to rule out a structural cause of her gait disturbance and balance problem.  She is agreeable.  We will call her with her test results.  She is advised to follow-up routinely after her testing and talk  to you in the interim about her medication management.  I did not suggest any new medications from my end of things.  She is also advised that she may not be safe to live by  herself as she has fallen multiple times.  She is in the least advised to get a call alert button such as life alert. She is also advised that she may not be safe to drive.  She is advised to talk to you about potentially talking to a Pharm.D. or pharmacist to review her medication regimen.  I answered all their questions today and the patient and Her mother were in agreement. Thank you very much for allowing me to participate in the care of this nice patient. If I can be of any further assistance to you please do not hesitate to call me at [phone].  Sincerely,   Huston Foley, MD, PhD

## 2019-05-08 NOTE — Patient Instructions (Signed)
I think you have a nonspecific gait disturbance and balance disorder secondary to multiple factors.  I believe your biggest issue affecting your balance and gait is the fact that you are taking multiple sedating medications.  Please talk to your psychiatrist, your pain management doctor and your primary care physician about streamlining your medications and reducing some of these sedating medications.  In addition, you may benefit from speaking with a pharmacology doctor or Pharm.D. or pharmacist about your medication regimen and possible interaction and sedation.  You have no obvious focal abnormalities in your neurological exam.  Please use your walker at all times.  I do not believe you are safe to drive or safe to live by yourself as you have fallen many times.  Please consider getting a call alert button such as life alert and please consider having someone stay with you or you could stay with someone.  From the neurological work-up stand of things I would like to order a brain MRI with and without contrast, we will check your blood test today to make sure your kidney function is okay to take contrast.  I would like to order a home sleep test to rule out underlying obstructive sleep apnea.  Sleep deprivation and dehydration may also be contributors to your balance problem and gait disorder.Please stand up slowly and get your bearings before you start walking.

## 2019-05-09 LAB — COMPREHENSIVE METABOLIC PANEL
ALT: 18 IU/L (ref 0–32)
AST: 23 IU/L (ref 0–40)
Albumin/Globulin Ratio: 1.4 (ref 1.2–2.2)
Albumin: 4 g/dL (ref 3.8–4.8)
Alkaline Phosphatase: 129 IU/L — ABNORMAL HIGH (ref 39–117)
BUN/Creatinine Ratio: 8 — ABNORMAL LOW (ref 12–28)
BUN: 8 mg/dL (ref 8–27)
Bilirubin Total: 0.3 mg/dL (ref 0.0–1.2)
CO2: 26 mmol/L (ref 20–29)
Calcium: 8.8 mg/dL (ref 8.7–10.3)
Chloride: 105 mmol/L (ref 96–106)
Creatinine, Ser: 1.02 mg/dL — ABNORMAL HIGH (ref 0.57–1.00)
GFR calc Af Amer: 67 mL/min/{1.73_m2} (ref >59)
GFR calc non Af Amer: 58 mL/min/{1.73_m2} — ABNORMAL LOW (ref >59)
Globulin, Total: 2.9 g/dL (ref 1.5–4.5)
Glucose: 117 mg/dL — ABNORMAL HIGH (ref 65–99)
Potassium: 4.8 mmol/L (ref 3.5–5.2)
Sodium: 144 mmol/L (ref 134–144)
Total Protein: 6.9 g/dL (ref 6.0–8.5)

## 2019-05-09 NOTE — Progress Notes (Signed)
Blood chemistry test showed no significant abnormalities, except mildly elevated alkaline phosphatase, which is one of the liver enzymes and can be elevated in the context of taking chronic meds. She can have it rechecked routinely by her PCP. Please update pt.

## 2019-05-20 ENCOUNTER — Ambulatory Visit: Payer: Medicare Other | Admitting: Neurology

## 2019-06-03 ENCOUNTER — Ambulatory Visit (INDEPENDENT_AMBULATORY_CARE_PROVIDER_SITE_OTHER): Payer: Medicare Other | Admitting: Neurology

## 2019-06-03 DIAGNOSIS — H81399 Other peripheral vertigo, unspecified ear: Secondary | ICD-10-CM

## 2019-06-03 DIAGNOSIS — G4733 Obstructive sleep apnea (adult) (pediatric): Secondary | ICD-10-CM | POA: Diagnosis not present

## 2019-06-03 DIAGNOSIS — Z6841 Body Mass Index (BMI) 40.0 and over, adult: Secondary | ICD-10-CM

## 2019-06-03 DIAGNOSIS — R269 Unspecified abnormalities of gait and mobility: Secondary | ICD-10-CM

## 2019-06-03 DIAGNOSIS — Z79899 Other long term (current) drug therapy: Secondary | ICD-10-CM

## 2019-06-03 DIAGNOSIS — R42 Dizziness and giddiness: Secondary | ICD-10-CM

## 2019-06-03 DIAGNOSIS — G479 Sleep disorder, unspecified: Secondary | ICD-10-CM

## 2019-06-10 NOTE — Addendum Note (Signed)
Addended by: Huston Foley on: 06/10/2019 08:36 AM   Modules accepted: Orders

## 2019-06-10 NOTE — Procedures (Signed)
Patient Information     First Name: Teresa Last Name: Casey ID: [id number]  Birth Date: May 06, 1955 Age: 64 Gender: Female  Referring Provider: Catha Gosselin, MD BMI: 40.8 (W=260 lb, H=5' 7'')    Epworth:  6/24   Sleep Study Information    Study Date: Jun 04, 2019 S/H/A Version: [id number] / 4.0.1515 / 45  History:    64 year old woman with a history of depression, anemia, diabetes, mitral valve prolapse, history of blood clots, history of pulmonary embolism, heart murmur, reflux disease, Bell's palsy, hyperlipidemia, diabetic nephropathy, diabetic neuropathy, chronic neck pain, hypertension, chronic kidney disease, and morbid obesity with a BMI of over 40, who reports an approximately 10-month history of feeling dizzy and off balance.  She has had several falls.  She does not sleep well. Summary & Diagnosis:     OSA Recommendations:      This home sleep test demonstrates overall mild obstructive sleep apnea with significant desaturations, with a total AHI of 12.1/hour and O2 nadir of 76%. Given the patient's medical history and sleep related complaints, treatment with positive airway pressure is recommended. This can be achieved in the form of autoPAP trial/titration at home. A  full night CPAP titration study will help with proper treatment settings and mask fitting, if needed. Alternative treatments may include weight loss along with avoidance of the supine sleep position, or an oral appliance in appropriate candidates.   Please note that untreated obstructive sleep apnea may carry additional perioperative morbidity. Patients with significant obstructive sleep apnea should receive perioperative PAP therapy and the surgeons and particularly the anesthesiologist should be informed of the diagnosis and the severity of the sleep disordered breathing. The patient should be cautioned not to drive, work at heights, or operate dangerous or heavy equipment when tired or sleepy. Review and reiteration of  good sleep hygiene measures should be pursued with any patient. Other causes of the patient's symptoms, including circadian rhythm disturbances, an underlying mood disorder, medication effect and/or an underlying medical problem cannot be ruled out based on this test. Clinical correlation is recommended.  The patient and her referring provider will be notified of the test results. The patient will be seen in follow up in sleep clinic at [facility].  I certify that I have reviewed the raw data recording prior to the issuance of this report in accordance with the standards of the American Academy of Sleep Medicine (AASM).  Huston Foley, MD, PhD [facility]) Diplomat, ABPN (Neurology and Sleep)          Sleep Summary  Oxygen Saturation Statistics   Start Study Time: End Study Time: Total Recording Time:  1:27:32 AM         10:12:10 AM   8 h, 44 min  Total Sleep Time % REM of Sleep Time:  7 h, 18 min  17.8    Mean: 87 Minimum: 76 Maximum: 96  Mean of Desaturations Nadirs (%):   85  Oxygen Desaturation. %: 4-9 10-20 >20 Total  Events Number Total  38 88.4  5 11.6  0 0.0  43 100.0  Oxygen Saturation:  <90 <=88 <85 <80 <70  Duration (minutes): Sleep % 334.8 313.8 76.4 71.6 40.9 9.3 1.1 0.2 0.0 0.0     Respiratory Indices      Total Events REM NREM All Night  pRDI:  88  pAHI:  88 ODI:  43  pAHIc:  0  % CSR: 0.0 35.8 35.8 23.3 0.0 7.0 7.0 2.2  0.0 12.1 12.1 5.9 0.0       Pulse Rate Statistics during Sleep (BPM)      Mean: 85 Minimum: 47 Maximum: 114    Indices are calculated using technically valid sleep time of  7 hrs, 15 min. Central-Indices are calculated using technically valid sleep time of  4  hrs, 24 min. pRDI/pAHI are calculated using oxi desaturations ? 3%  Body Position Statistics  Position Supine Prone Right Left Non-Supine  Sleep (min) 437.0 0.0 0.0 0.0 0.0  Sleep % 99.8 0.0 0.0 0.0 0.0  pRDI 12.2 N/A N/A N/A N/A  pAHI  12.2 N/A N/A N/A N/A  ODI 5.9 N/A N/A N/A N/A     Snoring Statistics Snoring Level (dB) >40 >50 >60 >70 >80 >Threshold (45)  Sleep (min) 408.3 322.0 201.2 0.1 0.0 358.0  Sleep % 93.2 73.5 45.9 0.0 0.0 81.7    Mean: 56 dB Sleep Stages Chart                   pAHI=12.1                               Mild              Moderate                    Severe                                                 5              15                    30

## 2019-06-10 NOTE — Progress Notes (Signed)
Patient referred by Dr. Clarene Duke, seen by me on 05/08/19, HST on 06/04/19.    Please call and notify the patient that the recent home sleep test showed mild obstructive sleep apnea with significant desaturations, as low as mild 70s. I recommend treatment with an autoPAP machine, which means, that we don't have to bring her in for a sleep study with CPAP, but will let her try an autoPAP machine at home, through a DME company (of her choice, or as per insurance requirement). The DME representative will educate her on how to use the machine, how to put the mask on, etc. I have placed an order in the chart. Please send referral, talk to patient, send report to referring MD. We will need a FU in sleep clinic for 10 weeks post-PAP set up, please arrange that with me or one of our NPs. Thanks,   Huston Foley, MD, PhD [facility])

## 2019-06-11 ENCOUNTER — Telehealth: Payer: Self-pay

## 2019-06-11 NOTE — Telephone Encounter (Signed)
-----   Message from Huston Foley, MD sent at 06/10/2019  8:35 AM EST ----- Patient referred by Dr. Clarene Duke, seen by me on 05/08/19, HST on 06/04/19.    Please call and notify the patient that the recent home sleep test showed mild obstructive sleep apnea with significant desaturations, as low as mild 70s. I recommend treatment with an autoPAP machine, which means, that we don't have to bring her in for a sleep study with CPAP, but will let her try an autoPAP machine at home, through a DME company (of her choice, or as per insurance requirement). The DME representative will educate her on how to use the machine, how to put the mask on, etc. I have placed an order in the chart. Please send referral, talk to patient, send report to referring MD. We will need a FU in sleep clinic for 10 weeks post-PAP set up, please arrange that with me or one of our NPs. Thanks,   Huston Foley, MD, PhD [facility])

## 2019-06-11 NOTE — Telephone Encounter (Signed)
Lvm asking pt for a call back.

## 2019-06-17 NOTE — Telephone Encounter (Signed)
Left second vm asking for pt to call me back.  

## 2019-06-18 NOTE — Telephone Encounter (Signed)
Pt returned my call and we discussed study results. Pt sts she want's to take a few days and think this information over until she commits to the auto pap therapy.

## 2019-08-07 ENCOUNTER — Ambulatory Visit: Payer: Medicare Other | Admitting: Family Medicine

## 2019-10-03 ENCOUNTER — Other Ambulatory Visit: Payer: Self-pay

## 2019-10-03 ENCOUNTER — Encounter (HOSPITAL_COMMUNITY): Payer: Self-pay

## 2019-10-03 DIAGNOSIS — E049 Nontoxic goiter, unspecified: Secondary | ICD-10-CM | POA: Insufficient documentation

## 2019-10-03 DIAGNOSIS — R791 Abnormal coagulation profile: Secondary | ICD-10-CM | POA: Diagnosis present

## 2019-10-03 DIAGNOSIS — E119 Type 2 diabetes mellitus without complications: Secondary | ICD-10-CM | POA: Diagnosis not present

## 2019-10-03 LAB — CBC WITH DIFFERENTIAL/PLATELET
Abs Immature Granulocytes: 0.04 10*3/uL (ref 0.00–0.07)
Basophils Absolute: 0.1 10*3/uL (ref 0.0–0.1)
Basophils Relative: 1 %
Eosinophils Absolute: 0.3 10*3/uL (ref 0.0–0.5)
Eosinophils Relative: 2 %
HCT: 40.6 % (ref 36.0–46.0)
Hemoglobin: 12.4 g/dL (ref 12.0–15.0)
Immature Granulocytes: 0 %
Lymphocytes Relative: 21 %
Lymphs Abs: 2.5 10*3/uL (ref 0.7–4.0)
MCH: 28.4 pg (ref 26.0–34.0)
MCHC: 30.5 g/dL (ref 30.0–36.0)
MCV: 93.1 fL (ref 80.0–100.0)
Monocytes Absolute: 0.6 10*3/uL (ref 0.1–1.0)
Monocytes Relative: 5 %
Neutro Abs: 8.6 10*3/uL — ABNORMAL HIGH (ref 1.7–7.7)
Neutrophils Relative %: 71 %
Platelets: 284 10*3/uL (ref 150–400)
RBC: 4.36 MIL/uL (ref 3.87–5.11)
RDW: 14.4 % (ref 11.5–15.5)
WBC: 12 10*3/uL — ABNORMAL HIGH (ref 4.0–10.5)
nRBC: 0 % (ref 0.0–0.2)

## 2019-10-03 LAB — COMPREHENSIVE METABOLIC PANEL
ALT: 19 U/L (ref 0–44)
AST: 25 U/L (ref 15–41)
Albumin: 4 g/dL (ref 3.5–5.0)
Alkaline Phosphatase: 113 U/L (ref 38–126)
Anion gap: 9 (ref 5–15)
BUN: 13 mg/dL (ref 8–23)
CO2: 29 mmol/L (ref 22–32)
Calcium: 8.7 mg/dL — ABNORMAL LOW (ref 8.9–10.3)
Chloride: 101 mmol/L (ref 98–111)
Creatinine, Ser: 1.03 mg/dL — ABNORMAL HIGH (ref 0.44–1.00)
GFR calc Af Amer: >60 mL/min (ref >60)
GFR calc non Af Amer: 57 mL/min — ABNORMAL LOW (ref >60)
Glucose, Bld: 154 mg/dL — ABNORMAL HIGH (ref 70–99)
Potassium: 4.4 mmol/L (ref 3.5–5.1)
Sodium: 139 mmol/L (ref 135–145)
Total Bilirubin: 0.4 mg/dL (ref 0.3–1.2)
Total Protein: 8.3 g/dL — ABNORMAL HIGH (ref 6.5–8.1)

## 2019-10-03 LAB — URINALYSIS, ROUTINE W REFLEX MICROSCOPIC
Bacteria, UA: NONE SEEN
Bilirubin Urine: NEGATIVE
Glucose, UA: NEGATIVE mg/dL
Hgb urine dipstick: NEGATIVE
Ketones, ur: NEGATIVE mg/dL
Nitrite: NEGATIVE
Protein, ur: NEGATIVE mg/dL
Specific Gravity, Urine: 1.005 (ref 1.005–1.030)
pH: 7 (ref 5.0–8.0)

## 2019-10-03 LAB — MAGNESIUM: Magnesium: 2.4 mg/dL (ref 1.7–2.4)

## 2019-10-03 NOTE — ED Triage Notes (Signed)
Arrived POV from home. Patient sent by urgent care because of abnormal labs. Patient states she went to doctor today for generalized weakness and fatigue [facility]) Patient says she cannot remember which lab was abnormal.

## 2019-10-04 ENCOUNTER — Other Ambulatory Visit: Payer: Self-pay

## 2019-10-04 ENCOUNTER — Encounter (HOSPITAL_COMMUNITY): Payer: Self-pay

## 2019-10-04 ENCOUNTER — Emergency Department (HOSPITAL_COMMUNITY): Payer: Medicare Other

## 2019-10-04 ENCOUNTER — Emergency Department (HOSPITAL_COMMUNITY)
Admission: EM | Admit: 2019-10-04 | Discharge: 2019-10-04 | Disposition: A | Discharge status: Home / Self Care | Payer: Medicare Other | Attending: Emergency Medicine | Admitting: Emergency Medicine

## 2019-10-04 DIAGNOSIS — E049 Nontoxic goiter, unspecified: Secondary | ICD-10-CM

## 2019-10-04 DIAGNOSIS — R7989 Other specified abnormal findings of blood chemistry: Secondary | ICD-10-CM

## 2019-10-04 MED ORDER — IOHEXOL 350 MG/ML SOLN
100.0000 mL | Freq: Once | INTRAVENOUS | Status: AC | PRN
  Administered 2019-10-04: 100 mL via INTRAVENOUS

## 2019-10-04 MED ORDER — SODIUM CHLORIDE (PF) 0.9 % IJ SOLN
INTRAMUSCULAR | Status: AC
  Administered 2019-10-04: 1 mL
  Filled 2019-10-04: qty 50

## 2019-10-04 NOTE — ED Provider Notes (Signed)
Emergency Department Provider Note  I have reviewed the triage vital signs and the nursing notes.  HISTORY  Chief Complaint abnormal labs   HPI [name] is a 64 y.o. female with multiple medical problems documented below who presents to the emergency department today secondary to abnormal lab value.  Ultimately found to be a D-dimer.  Patient states that she had intermittent episodes of weakness over the last couple months.  She was seen at her doctor's office and referred to neurologist and then seen again in her doctor's office about 6 weeks ago for lower extremity weakness but that resolved within yesterday she had acute onset of lower extremity and upper extremity generalized weakness.  No falls with this no focal weakness just felt generally unwell and off balance when she stood up.  This is pretty much resolved at this point patient went to the walk-in clinic and had a battery of labs done and apparently her D-dimer came back above 1.   No other associated or modifying symptoms.    Past Medical History:  Diagnosis Date  . Anemia    hx of   . Contact lens/glasses fitting   . Depression   . Diabetes mellitus   . Fatigue    due to loss of sleep  . Fracture of right heel 2006  . GERD (gastroesophageal reflux disease)   . 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 Herpes   . Hx of blood clots   . Mitral valve prolapse    asymptomatic   . Morbid obesity (HCC)   . 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 Night sweats   . PONV (postoperative nausea and vomiting)   . Pulmonary embolism (HCC) 2009  . Suicidal ideation   . Weight gain     Patient Active Problem List   Diagnosis Date Noted  . Dry eye syndrome, bilateral 06/20/2017  . Meibomian gland dysfunction (MGD), bilateral, both upper and lower lids 06/20/2017  . Diabetes mellitus type 2 without retinopathy (HCC) 11/19/2012  . Leucocytosis 03/13/2012  . Depression with  suicidal ideation 02/18/2012  . Morbid obesity (HCC) 07/14/2011  . 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 ABDOMINAL HYSTERECTOMY  2003  . BREATH TEK H PYLORI  01/09/2012   Procedure: BREATH TEK H PYLORI;  Surgeon: Kandis Cocking, MD;  Location: [facility];  Service: General;  Laterality: N/A;  . EYE SURGERY  03/2001   lasik  . HYSTEROSCOPY  2002  . LAPAROSCOPY  2002  . pseudotumor cerebri    . right heel fracture      surgery due to trauma- 2006     Current Outpatient Rx  . Order #: [id number] Class: Historical Med  . Order #: [id number] Class: Historical Med  . Order #: [id number] Class: Historical Med  . Order #: [id number] Class: Historical Med  . Order #: [id number] Class: Historical Med  . Order #: [id number] Class: Historical Med  . Order #: [id number] Class: Historical Med  . Order #: [id number] Class: Historical Med  . Order #: [id number] Class: Historical Med  . Order #: [id number] Class: Historical Med  . Order #: [id number] Class: Historical Med  . Order #: [id number] Class: Historical Med  . Order #: [id number] Class: Historical Med  . Order #: [id number] Class: Historical Med  . Order #: [id number] Class: Historical Med  . Order #: [id number] Class: Historical Med  . Order #: [id number] Class:  Historical Med  . Order #: [id number] Class: Historical Med  . Order #: [id number] Class: Print  . Order #: [id number] Class: Historical Med  . Order #: [id number] Class: Historical Med    Allergies Augmentin [amoxicillin-pot clavulanate], Metformin and related, and Tegretol [carbamazepine]  Family History  Adopted: Yes    Social History Social History   Tobacco Use  . Smoking status: Never Smoker  . Smokeless tobacco: Never Used  Substance Use Topics  . Alcohol use: No  . Drug use: No    Review of Systems  All other systems negative except as documented in the HPI. All pertinent positives and negatives as reviewed in  the HPI. ____________________________________________  PHYSICAL EXAM:  VITAL SIGNS: ED Triage Vitals  Enc Vitals Group     BP 10/03/19 2129 (!) 181/92     Pulse Rate 10/03/19 2129 (!) 109     Resp 10/03/19 2129 (!) 23     Temp 10/03/19 2129 98.1 F (36.7 C)     Temp Source 10/03/19 2129 Oral     SpO2 10/03/19 2129 97 %     Weight 10/03/19 2129 225 lb (102.1 kg)     Height 10/03/19 2129 5\' 7"  (1.702 m)    Constitutional: Alert and oriented. Well appearing and in no acute distress. Eyes: Conjunctivae are normal. PERRL. EOMI. Head: Atraumatic. Nose: No congestion/rhinnorhea. Mouth/Throat: Mucous membranes are moist.  Oropharynx non-erythematous. Neck: No stridor.  No meningeal signs.   Cardiovascular: tachycardic rate, regular rhythm. Good peripheral circulation. Grossly normal heart sounds.   Respiratory: tachypneic respiratory effort.  No retractions. Lungs CTAB. Gastrointestinal: Soft and nontender. No distention.  Musculoskeletal: No lower extremity tenderness nor edema. No gross deformities of extremities. Neurologic:  Normal speech and language. No gross focal neurologic deficits are appreciated.  Skin:  Skin is warm, dry and intact. No rash noted.  ____________________________________________   LABS (all labs ordered are listed, but only abnormal results are displayed)  Labs Reviewed  CBC WITH DIFFERENTIAL/PLATELET - Abnormal; Notable for the following components:      Result Value   WBC 12.0 (*)    Neutro Abs 8.6 (*)    All other components within normal limits  COMPREHENSIVE METABOLIC PANEL - Abnormal; Notable for the following components:   Glucose, Bld 154 (*)    Creatinine, Ser 1.03 (*)    Calcium 8.7 (*)    Total Protein 8.3 (*)    GFR calc non Af Amer 57 (*)    All other components within normal limits  URINALYSIS, ROUTINE W REFLEX MICROSCOPIC - Abnormal; Notable for the following components:   Color, Urine STRAW (*)    Leukocytes,Ua TRACE (*)    All  other components within normal limits  MAGNESIUM   ____________________________________________  EKG   EKG Interpretation  Date/Time:  Friday October 04 2019 00:42:05 EDT Ventricular Rate:  104 PR Interval:    QRS Duration: 90 QT Interval:  347 QTC Calculation: 457 R Axis:   42 Text Interpretation: Sinus tachycardia Low voltage, precordial leads No significant change since last tracing Confirmed by [id number] [phone]) on 10/04/2019 12:49:55 AM       ____________________________________________  RADIOLOGY  No results found. ____________________________________________  PROCEDURES  Procedure(s) performed:   Procedures ____________________________________________  INITIAL IMPRESSION / ASSESSMENT AND PLAN / ED COURSE   This patient presents to the ED for concern of elevated d dimer, this involves an extensive number of treatment options, and is a complaint that carries with it a high risk of complications and  morbidity.  The differential diagnosis includes lab error, normal range for patient, PE, DVT.     Lab Tests:   I Ordered, reviewed, and interpreted labs, which included cbc, cmp, mg and UA   Medicines ordered:   None indicated  Imaging Studies ordered:   I independently visualized and interpreted imaging CTA which showed no PE or lung abnormalities. Radiology read as enlarged thyroid, needs [place], will FU w/ PCP for same.   Additional history obtained:   Additional history obtained from her physician, Dr. Rex Kras, who advised her d dimer was elevated.  Previous records obtained and reviewed in Epic  Consultations Obtained:   I consulted Her PCP for previous labs  A medical screening exam was performed and I feel the patient has had an appropriate workup for their chief complaint at this time and likelihood of emergent condition existing is low. They have been counseled on decision, discharge, follow up and which symptoms necessitate immediate return to  the emergency department. They or their family verbally stated understanding and agreement with plan and discharged in stable condition.   ____________________________________________  FINAL CLINICAL IMPRESSION(S) / ED DIAGNOSES  Final diagnoses:  Positive D dimer  Enlarged thyroid    MEDICATIONS GIVEN DURING THIS VISIT:  Medications  iohexol (OMNIPAQUE) 350 MG/ML injection 100 mL (100 mLs Intravenous Contrast Given 10/04/19 0128)  sodium chloride (PF) 0.9 % injection (1 mL  Given by Other 10/04/19 0144)    NEW OUTPATIENT MEDICATIONS STARTED DURING THIS VISIT:  Discharge Medication List as of 10/04/2019  3:20 AM      Note:  This note was prepared with assistance of Dragon voice recognition software. Occasional wrong-word or sound-a-like substitutions may have occurred due to the inherent limitations of voice recognition software.   Treshaun Carrico, Corene Cornea, MD 10/04/19 [phone]

## 2019-10-29 ENCOUNTER — Telehealth: Payer: Self-pay | Admitting: Podiatry

## 2019-10-29 NOTE — Telephone Encounter (Signed)
I called pt and informed that our office could not refill the Tramadol, she had not been seen in office in over 6 months, I offered an appt and she states she has one 10/31/2019 at 3:45pm. I instructed her to ask for the tramadol refill at that time.

## 2019-10-29 NOTE — Telephone Encounter (Signed)
pty called requesting a refill of tramadol  Pt has not been seen since 12/2018 told pt sahe may have to be seen please assist

## 2019-10-31 ENCOUNTER — Other Ambulatory Visit: Payer: Self-pay

## 2019-10-31 ENCOUNTER — Encounter: Payer: Self-pay | Admitting: Podiatry

## 2019-10-31 ENCOUNTER — Ambulatory Visit: Payer: Medicare Other | Admitting: Podiatry

## 2019-10-31 DIAGNOSIS — M7672 Peroneal tendinitis, left leg: Secondary | ICD-10-CM | POA: Diagnosis not present

## 2019-10-31 MED ORDER — TRAMADOL HCL 50 MG PO TABS: ORAL_TABLET | ORAL | 0 refills | Status: DC

## 2019-10-31 MED ORDER — TRAMADOL HCL 50 MG PO TABS: ORAL_TABLET | ORAL | 0 refills | Status: AC

## 2019-10-31 NOTE — Progress Notes (Signed)
She presents today states that she has been doing great for the past year.  She presents today for follow-up of her peroneal tendinitis left foot.  States that is just starting to bother me again and it kind of hurts sort of all over the place.  Objective: Vital signs are stable alert oriented x3.  Has pain on palpation of the arcuate portion of the cuboid with the peroneus longus dives deep into the foot.  Exquisitely tender on palpation.  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  Plan: Injected the area today with Kenalog and local anesthetic.  Instructed her to obtain Voltaren gel for the remainder of the aching across the dorsum of the foot.  Follow-up with her as needed

## 2020-01-15 ENCOUNTER — Other Ambulatory Visit (HOSPITAL_COMMUNITY): Payer: Self-pay | Admitting: General Surgery

## 2020-01-15 ENCOUNTER — Other Ambulatory Visit: Payer: Self-pay | Admitting: General Surgery

## 2020-01-21 ENCOUNTER — Ambulatory Visit: Payer: Medicare Other | Admitting: Cardiovascular Disease

## 2020-01-21 ENCOUNTER — Encounter: Payer: Self-pay | Admitting: Cardiovascular Disease

## 2020-01-21 ENCOUNTER — Other Ambulatory Visit: Payer: Self-pay

## 2020-01-21 VITALS — BP 144/76 | HR 98 | Ht 67.0 in | Wt 255.4 lb

## 2020-01-21 DIAGNOSIS — I10 Essential (primary) hypertension: Secondary | ICD-10-CM | POA: Insufficient documentation

## 2020-01-21 DIAGNOSIS — E782 Mixed hyperlipidemia: Secondary | ICD-10-CM

## 2020-01-21 DIAGNOSIS — Z01810 Encounter for preprocedural cardiovascular examination: Secondary | ICD-10-CM

## 2020-01-21 DIAGNOSIS — E785 Hyperlipidemia, unspecified: Secondary | ICD-10-CM | POA: Insufficient documentation

## 2020-01-21 NOTE — Assessment & Plan Note (Signed)
History of essential hypertension blood pressure measured today at 144/76.  She is on losartan.

## 2020-01-21 NOTE — Progress Notes (Signed)
01/21/2020 [name]   08-22-1955  [id number]  Primary Physician Catha Gosselin, MD Primary Cardiologist: Runell Gess MD Nicholes Calamity, [place]  HPI:  [name] is a 64 y.o. morbidly overweight widowed Caucasian female mother of 2 sons, grandmother of 4 grandchildren referred by her PCP, Dr. Clarene Duke, for preoperative clearance before anticipated laparoscopic bariatric surgery by Dr. Gaynelle Adu.  She is currently disabled which she has been since 2006 because of injury to her foot.  She does have a history of treated hypertension hyperlipidemia.  She is adopted so does not know her family history.  She is never had a heart tach or stroke.  She denies chest pain or shortness of breath.     No outpatient medications have been marked as taking for the 01/21/20 encounter (Office Visit) with Runell Gess, MD.     Allergies  Allergen Reactions  . Augmentin [Amoxicillin-Pot Clavulanate] Diarrhea  . Metformin And Related Diarrhea  . Tegretol [Carbamazepine] Nausea And Vomiting    Social History   Socioeconomic History  . Marital status: Widowed    Spouse name: Not on file  . Number of children: Not on file  . Years of education: Not on file  . Highest education level: Not on file  Occupational History  . Not on file  Tobacco Use  . Smoking status: Never Smoker  . Smokeless tobacco: Never Used  Substance and Sexual Activity  . Alcohol use: No  . Drug use: No  . Sexual activity: Not Currently    Birth control/protection: Post-menopausal  Other Topics Concern  . Not on file  Social History Narrative  . Not on file   Social Determinants of Health   Financial Resource Strain:   . Difficulty of Paying Living Expenses: Not on file  Food Insecurity:   . Worried About Programme researcher, broadcasting/film/video in the Last Year: Not on file  . Ran Out of Food in the Last Year: Not on file  Transportation Needs:   . Lack of Transportation (Medical): Not on file  . Lack of  Transportation (Non-Medical): Not on file  Physical Activity:   . Days of Exercise per Week: Not on file  . Minutes of Exercise per Session: Not on file  Stress:   . Feeling of Stress : Not on file  Social Connections:   . Frequency of Communication with Friends and Family: Not on file  . Frequency of Social Gatherings with Friends and Family: Not on file  . Attends Religious Services: Not on file  . Active Member of Clubs or Organizations: Not on file  . Attends Banker Meetings: Not on file  . Marital Status: Not on file  Intimate Partner Violence:   . Fear of Current or Ex-Partner: Not on file  . Emotionally Abused: Not on file  . Physically Abused: Not on file  . Sexually Abused: Not on file     Review of Systems: General: negative for chills, fever, night sweats or weight changes.  Cardiovascular: negative for chest pain, dyspnea on exertion, edema, orthopnea, palpitations, paroxysmal nocturnal dyspnea or shortness of breath Dermatological: negative for rash Respiratory: negative for cough or wheezing Urologic: negative for hematuria Abdominal: negative for nausea, vomiting, diarrhea, bright red blood per rectum, melena, or hematemesis Neurologic: negative for visual changes, syncope, or dizziness All other systems reviewed and are otherwise negative except as noted above.    Blood pressure (!) 144/76, pulse 98, height 5\' 7"  (  1.702 m), weight 255 lb 6.4 oz (115.8 kg), SpO2 97 %.  General appearance: alert and no distress Neck: no adenopathy, no carotid bruit, no JVD, supple, symmetrical, trachea midline and thyroid not enlarged, symmetric, no tenderness/mass/nodules Lungs: clear to auscultation bilaterally Heart: regular rate and rhythm, S1, S2 normal, no murmur, click, rub or gallop Extremities: extremities normal, atraumatic, no cyanosis or edema Pulses: 2+ and symmetric Skin: Skin color, texture, turgor normal. No rashes or lesions Neurologic: Alert and  oriented X 3, normal strength and tone. Normal symmetric reflexes. Normal coordination and gait  EKG sinus rhythm at 98 with poor R wave progression and low limb voltage.  I personally reviewed this EKG.  ASSESSMENT AND PLAN:   Morbid obesity (HCC) History morbid obesity be considered for laparoscopic bariatric surgery.  She was sent here for preoperative clearance.  She has minimal cardiac risk factors and this is a low risk procedure.  I do not feel need for functional testing.  We will check a 2D echocardiogram however.  Essential hypertension History of essential hypertension blood pressure measured today at 144/76.  She is on losartan.  Hyperlipidemia History of hyperlipidemia on statin therapy with lipid profile performed 08/21/2019 revealing total cholesterol 99, LDL 40 and HDL 40.      Runell Gess MD FACP,FACC,FAHA, [facility] 01/21/2020 9:46 AM

## 2020-01-21 NOTE — Assessment & Plan Note (Signed)
History morbid obesity be considered for laparoscopic bariatric surgery.  She was sent here for preoperative clearance.  She has minimal cardiac risk factors and this is a low risk procedure.  I do not feel need for functional testing.  We will check a 2D echocardiogram however.

## 2020-01-21 NOTE — Assessment & Plan Note (Signed)
History of hyperlipidemia on statin therapy with lipid profile performed 08/21/2019 revealing total cholesterol 99, LDL 40 and HDL 40.

## 2020-01-21 NOTE — Patient Instructions (Signed)
Medication Instructions:  No Changes In Medications at this time.  *If you need a refill on your cardiac medications before your next appointment, please call your pharmacy*   Lab Work: None Ordered At This Time.  If you have labs (blood work) drawn today and your tests are completely normal, you will receive your results only by: . MyChart Message (if you have MyChart) OR . A paper copy in the mail If you have any lab test that is abnormal or we need to change your treatment, we will call you to review the results.  Testing/Procedures: Your physician has requested that you have an echocardiogram. Echocardiography is a painless test that uses sound waves to create images of your heart. It provides your doctor with information about the size and shape of your heart and how well your heart's chambers and valves are working. You may receive an ultrasound enhancing agent through an IV if needed to better visualize your heart during the echo.This procedure takes approximately one hour. There are no restrictions for this procedure. This will take place at the [address], [address].   Follow-Up: At [facility], you and your health needs are our priority.  As part of our continuing mission to provide you with exceptional heart care, we have created designated Provider Care Teams.  These Care Teams include your primary Cardiologist (physician) and Advanced Practice Providers (APPs -  Physician Assistants and Nurse Practitioners) who all work together to provide you with the care you need, when you need it.  Your next appointment:   AS NEEDED    The format for your next appointment:   In Person  Provider:   Jonathan Berry, MD  

## 2020-01-28 ENCOUNTER — Ambulatory Visit (HOSPITAL_COMMUNITY)
Admission: RE | Admit: 2020-01-28 | Discharge: 2020-01-28 | Disposition: A | Discharge status: Home / Self Care | Payer: Medicare Other | Source: Ambulatory Visit | Attending: General Surgery | Admitting: General Surgery

## 2020-01-28 ENCOUNTER — Other Ambulatory Visit: Payer: Self-pay

## 2020-02-04 ENCOUNTER — Encounter: Payer: Self-pay | Admitting: Dietician

## 2020-02-04 ENCOUNTER — Other Ambulatory Visit: Payer: Self-pay

## 2020-02-04 ENCOUNTER — Encounter: Payer: Medicare Other | Attending: General Surgery | Admitting: Dietician

## 2020-02-04 DIAGNOSIS — I10 Essential (primary) hypertension: Secondary | ICD-10-CM | POA: Insufficient documentation

## 2020-02-04 DIAGNOSIS — Z713 Dietary counseling and surveillance: Secondary | ICD-10-CM | POA: Diagnosis not present

## 2020-02-04 DIAGNOSIS — Z6839 Body mass index (BMI) 39.0-39.9, adult: Secondary | ICD-10-CM | POA: Insufficient documentation

## 2020-02-04 DIAGNOSIS — Z86718 Personal history of other venous thrombosis and embolism: Secondary | ICD-10-CM | POA: Diagnosis not present

## 2020-02-04 DIAGNOSIS — K219 Gastro-esophageal reflux disease without esophagitis: Secondary | ICD-10-CM | POA: Insufficient documentation

## 2020-02-04 DIAGNOSIS — R011 Cardiac murmur, unspecified: Secondary | ICD-10-CM | POA: Insufficient documentation

## 2020-02-04 DIAGNOSIS — E669 Obesity, unspecified: Secondary | ICD-10-CM | POA: Insufficient documentation

## 2020-02-04 DIAGNOSIS — E1169 Type 2 diabetes mellitus with other specified complication: Secondary | ICD-10-CM | POA: Diagnosis not present

## 2020-02-04 NOTE — Progress Notes (Signed)
Nutrition Assessment for Bariatric Surgery Medical Nutrition Therapy   Patient was seen on 02/04/2020 for Pre-Operative Nutrition Assessment. Letter of approval faxed to [facility] bariatric surgery program coordinator on 02/04/2020.   Referral stated Supervised Weight Loss (SWL) visits needed: 6  Planned surgery: Sleeve Gastrectomy  Pt expectation of surgery: to lose weight and be more active   NUTRITION ASSESSMENT   Anthropometrics  Start weight at [facility]: 255 lbs (date: 02/04/2020) Height: 67 in BMI: 39.9 kg/m2     Lifestyle & Dietary Hx Pursued bariatric surgery about 9 years ago, but husband unexpectedly passed around that time. Currently lives alone, states she does not have a support system. Typical meal pattern is 2 meals plus a snack per day. States she finds herself binge eating/ mindless eating at times, so she avoids buying certain foods such as potato chips. States she does not have a stove, so will buy Healthy Choice meals to cook in the microwave.   Any previous deficiencies: anemia  Micronutrient Nutrition Focused Physical Exam: Hair: no issues observed Eyes: no issues observed Mouth: no issues observed Neck: no issues observed Nails: no issues observed Skin: no issues observed  24-Hr Dietary Recall First Meal: 2 Eggo waffles   Snack: - Second Meal: - Snack: - Third Meal: chef salad + light ranch dressing  Snack: - Beverages: water, Coke    NUTRITION DIAGNOSIS  Overweight/obesity ([place]-3.3) related to past poor dietary habits and physical inactivity as evidenced by patient w/ planned Sleeve surgery following dietary guidelines for continued weight loss.    NUTRITION INTERVENTION  Nutrition counseling (C-1) and education (E-2) to facilitate bariatric surgery goals.  Pre-Op Goals Reviewed with the Patient . Track food and beverage intake (pen and paper, MyFitness Pal, Baritastic app, etc.) . Make healthy food choices while monitoring portion  sizes . Consume 3 meals per day or try to eat every 3-5 hours . Avoid concentrated sugars and fried foods . Keep sugar & fat in the single digits per serving on food labels . Practice CHEWING your food (aim for applesauce consistency) . Practice not drinking 15 minutes before, during, and 30 minutes after each meal and snack . Avoid all carbonated beverages (ex: soda, sparkling beverages)  . Limit caffeinated beverages (ex: coffee, tea, energy drinks) . Avoid all sugar-sweetened beverages (ex: regular soda, sports drinks)  . Avoid alcohol  . Aim for 64-100 ounces of FLUID daily (with at least half of fluid intake being plain water)  . Aim for at least 60-80 grams of PROTEIN daily . Look for a liquid protein source that contains ?15 g protein and ?5 g carbohydrate (ex: shakes, drinks, shots) . Make a list of non-food related activities . Physical activity is an important part of a healthy lifestyle so keep it moving! The goal is to reach 150 minutes of exercise per week, including cardiovascular and weight baring activity.  *Goals that are bolded indicate the pt would like to start working towards these  Handouts Provided Include  . Bariatric Surgery handouts (Nutrition Visits, Pre-Op Goals, Protein Shakes, Vitamins & Minerals)  Learning Style & Readiness for Change Teaching method utilized: Visual & Auditory  Demonstrated degree of understanding via: Teach Back  Barriers to learning/adherence to lifestyle change: binge eating, does not have a stove   MONITORING & EVALUATION Dietary intake, weekly physical activity, body weight, and pre-op goals reached at next nutrition visit.   Next Steps Patient is to return to [facility] in 1 month for 1st SWL Visit.

## 2020-02-06 ENCOUNTER — Ambulatory Visit (HOSPITAL_COMMUNITY): Payer: Medicare Other | Attending: Internal Medicine

## 2020-02-06 ENCOUNTER — Other Ambulatory Visit: Payer: Self-pay

## 2020-02-06 DIAGNOSIS — Z01810 Encounter for preprocedural cardiovascular examination: Secondary | ICD-10-CM | POA: Diagnosis present

## 2020-02-06 DIAGNOSIS — I10 Essential (primary) hypertension: Secondary | ICD-10-CM | POA: Diagnosis present

## 2020-02-06 DIAGNOSIS — E782 Mixed hyperlipidemia: Secondary | ICD-10-CM | POA: Diagnosis present

## 2020-02-06 LAB — ECHOCARDIOGRAM COMPLETE
Area-P 1/2: 2.32 cm2
S' Lateral: 2 cm

## 2020-02-06 MED ORDER — PERFLUTREN LIPID MICROSPHERE
1.0000 mL | INTRAVENOUS | Status: AC | PRN
  Administered 2020-02-06: 3 mL via INTRAVENOUS

## 2020-03-05 ENCOUNTER — Other Ambulatory Visit: Payer: Self-pay

## 2020-03-05 ENCOUNTER — Encounter: Payer: Medicare Other | Attending: General Surgery | Admitting: Dietician

## 2020-03-05 ENCOUNTER — Encounter: Payer: Self-pay | Admitting: Dietician

## 2020-03-05 DIAGNOSIS — E1169 Type 2 diabetes mellitus with other specified complication: Secondary | ICD-10-CM | POA: Insufficient documentation

## 2020-03-05 DIAGNOSIS — Z6839 Body mass index (BMI) 39.0-39.9, adult: Secondary | ICD-10-CM | POA: Diagnosis not present

## 2020-03-05 DIAGNOSIS — Z713 Dietary counseling and surveillance: Secondary | ICD-10-CM | POA: Diagnosis not present

## 2020-03-05 DIAGNOSIS — E669 Obesity, unspecified: Secondary | ICD-10-CM

## 2020-03-05 NOTE — Patient Instructions (Addendum)
·   Focus on eating at least 3 times during your day. Try to go to bed and wake up at consistent times every day.   Great job with cutting out sodas and adding in fruit to your day! Keep up the great work.

## 2020-03-05 NOTE — Progress Notes (Signed)
Supervised Weight Loss Visit Bariatric Nutrition Education  Planned Surgery: Sleeve Gastrectomy  Pt Expectation of Surgery/ Goals: to lose weight and be more active  1 out of 6 SWL Appointments    NUTRITION ASSESSMENT  Anthropometrics  Start weight at [facility]: 255 lbs (date: 02/04/2020) Today's weight: 251.6 lbs BMI: 39.4 kg/m2    Lifestyle & Dietary Hx Patient states she bought a scale. States that she told her 2 sons she is planning to have bariatric surgery and was surprised that they are supportive of her decision. Patient is widowed and does not have much of a family support system. Often mentions how expensive food is, especially fruit and vegetables. Patient states she has completely cut out Coke and is only drinking water. Has incorporated fruit throughout her day as well as trail mix. States she has sleep issues and has a hard time sleeping and establishing a sleep schedule.   Estimated daily fluid intake: 64+ ounces Current average weekly physical activity: N/A  24-Hr Dietary Recall First Meal: fresh fruit Snack: - Second Meal: - Snack: pumpkin seed trail mix Third Meal: Lean Cuisine frozen meal Snack: - Beverages: water   NUTRITION DIAGNOSIS  Overweight/obesity ([place]-3.3) related to past poor dietary habits and physical inactivity as evidenced by patient w/ planned Sleeve surgery following dietary guidelines for continued weight loss.   NUTRITION INTERVENTION  Nutrition counseling (C-1) and education (E-2) to facilitate bariatric surgery goals.  Pre-Op Goals Progress & New Goals  Avoiding sodas  Drinking 64+ ounces of fluid daily   NEW: Aim to eat at least 3 times per day at consistent times, and try to go to bed and wake up at the same times each day   Learning Style & Readiness for Change Teaching method utilized: Visual & Auditory  Demonstrated degree of understanding via: Teach Back  Barriers to learning/adherence to lifestyle change: binge eating, does not  have a stove   MONITORING & EVALUATION Dietary intake, weekly physical activity, body weight, and pre-op goals in 1 month.   Next Steps  Patient is to return to [facility] in 1 month for 2nd SWL visit.

## 2020-04-02 ENCOUNTER — Encounter: Payer: Self-pay | Admitting: Dietician

## 2020-04-02 ENCOUNTER — Encounter: Payer: Medicare Other | Attending: General Surgery | Admitting: Dietician

## 2020-04-02 ENCOUNTER — Other Ambulatory Visit: Payer: Self-pay

## 2020-04-02 DIAGNOSIS — E1169 Type 2 diabetes mellitus with other specified complication: Secondary | ICD-10-CM | POA: Diagnosis not present

## 2020-04-02 DIAGNOSIS — E669 Obesity, unspecified: Secondary | ICD-10-CM

## 2020-04-02 DIAGNOSIS — Z6839 Body mass index (BMI) 39.0-39.9, adult: Secondary | ICD-10-CM | POA: Diagnosis not present

## 2020-04-02 NOTE — Progress Notes (Signed)
Supervised Weight Loss Visit Bariatric Nutrition Education  Planned Surgery: Sleeve Gastrectomy  Pt Expectation of Surgery/ Goals: to lose weight and be more active  2 out of 6 SWL Appointments   NUTRITION ASSESSMENT  Anthropometrics  Start weight at [facility]: 255 lbs (date: 02/04/2020) Today's weight: 249.6 lbs  BMI: 39.1 kg/m2    Lifestyle & Dietary Hx Patient states she has been working on her goal of eating at least 3 times per day which she states is going well. States that it may not be 3 "meals," but will at least have a snack or 2 between her breakfast and dinner meal. Typical meal pattern is 2 meals plus 1-2 snacks per day. Continues to do well with drinking lots of water. States she tried sugar-free water flavoring which upset her stomach. Patient asked about various protein supplements so we discussed bariatric appropriate options. Patient states she is lactose intolerant and asked about her options. Patient states she is getting better at her goal of avoiding eating/drinking within 2 hours of bedtime which helps her to sleep better and not get up to use the restroom throughout the night. When asked about taking supplements, patient states she takes vitamin B, D, and magnesium. We discussed adding a multivitamin, based on the observation that the patient does not have a nutrient-rich diet and it appears her hair is thinning.   Estimated daily fluid intake: 64+ ounces Supplements: vitamin D, magnesium, vitamin B Current average weekly physical activity: N/A  24-Hr Dietary Recall First Meal: 2 Eggo waffles Snack: fruit Second Meal: - Snack: fruit Third Meal: Lean Cuisine frozen meal Snack: - Beverages: water   NUTRITION DIAGNOSIS  Overweight/obesity ([place]-3.3) related to past poor dietary habits and physical inactivity as evidenced by patient w/ planned Sleeve surgery following dietary guidelines for continued weight loss.   NUTRITION INTERVENTION  Nutrition counseling (C-1)  and education (E-2) to facilitate bariatric surgery goals.  Pre-Op Goals Progress & New Goals  Avoiding sodas  Drinking 64+ ounces of fluid daily   Eating at least 3 times per day at consistent times, and working on going to bed and waking up at the same times each day   NEW: Try some protein shakes  NEW: Add a multivitamin   Learning Style & Readiness for Change Teaching method utilized: Visual & Auditory  Demonstrated degree of understanding via: Teach Back  Barriers to learning/adherence to lifestyle change: binge eating, does not have a stove   MONITORING & EVALUATION Dietary intake, weekly physical activity, body weight, and pre-op goals in 1 month.   Next Steps  Patient is to return to [facility] in 1 month for 3rd SWL visit.

## 2020-05-06 ENCOUNTER — Other Ambulatory Visit: Payer: Self-pay

## 2020-05-06 ENCOUNTER — Encounter: Payer: Medicare Other | Attending: General Surgery | Admitting: Skilled Nursing Facility1

## 2020-05-06 DIAGNOSIS — E669 Obesity, unspecified: Secondary | ICD-10-CM | POA: Diagnosis present

## 2020-05-06 DIAGNOSIS — E119 Type 2 diabetes mellitus without complications: Secondary | ICD-10-CM | POA: Diagnosis not present

## 2020-05-06 NOTE — Progress Notes (Signed)
Supervised Weight Loss Visit Bariatric Nutrition Education  Planned Surgery: Sleeve Gastrectomy  Pt Expectation of Surgery/ Goals: to lose weight and be more active  3 out of 6 SWL Appointments   NUTRITION ASSESSMENT  Anthropometrics  Start weight at [facility]: 255 lbs (date: 02/04/2020) Today's weight: 243.6 lbs  BMI: 38.12 kg/m2    Lifestyle & Dietary Hx  Pt does not have a stove.  Patient states she has been working on her goal of eating at least 3 times per day which she states is going well. States that it may not be 3 "meals," but will at least have a snack or 2 between her breakfast and dinner meal. Typical meal pattern is 2 meals plus 1-2 snacks per day. Continues to do well with drinking lots of water. States she tried sugar-free water flavoring which upset her stomach.Patient states she is getting better at her goal of avoiding eating/drinking within 2 hours of bedtime which helps her to sleep better and not get up to use the restroom throughout the night.   Pt states she wants to try some protein options that are lactose intolerant (this was discussed at her previous appt with options given).  Pt states her A1C is lower than 6.5 currently but does not remember what the actual number is. Pt states he does not check her blood sugar. Pt states she was a sugar/cokaholic but has been able to "get off of it". Pt states she is likely to have diarrhea if she eats high fat or milk products. Pt states she does not eat meat because she cannot afford it.  Pt state she has been disabled since breaking her heel bone.   Estimated daily fluid intake: 64+ ounces Supplements: vitamin D, magnesium, vitamin B Current average weekly physical activity: N/A Medical diagnosis: HTN, diabetes, depression  24-Hr Dietary Recall: waking around 1:30-2pm First Meal 6pm: 2 plain Eggo waffles Snack: watermelon Second Meal 10:30pm: salad: cucumber, tomato, lettuce, lite ranch, boiled egg Snack: strawberries +  splenda Third Meal:  Snack:  Beverages: water, diet dr pepper   NUTRITION DIAGNOSIS  Overweight/obesity ([place]-3.3) related to past poor dietary habits and physical inactivity as evidenced by patient w/ planned Sleeve surgery following dietary guidelines for continued weight loss.   NUTRITION INTERVENTION  Nutrition counseling (C-1) and education (E-2) to facilitate bariatric surgery goals.  Pre-Op Goals Progress & New Goals . Avoiding sodas . Drinking 64+ ounces of fluid daily  . Eating at least 3 times per day at consistent times, and working on going to bed and waking up at the same times each day  . Try some protein shakes . Add a multivitamin  . NEW: eat beans, eggs, lentils, tuna, canned chicken, edamame, plant based in the frozen food ailse for protein options . NEW: do the arm chair workouts daily  Learning Style & Readiness for Change Teaching method utilized: Visual & Auditory  Demonstrated degree of understanding via: Teach Back  Barriers to learning/adherence to lifestyle change: binge eating (pt stated), does not have a stove   MONITORING & EVALUATION Dietary intake, weekly physical activity, body weight, and pre-op goals   Next Steps  Patient is to return to [facility]

## 2020-05-27 ENCOUNTER — Other Ambulatory Visit: Payer: Medicare Other

## 2020-06-02 DIAGNOSIS — R404 Transient alteration of awareness: Secondary | ICD-10-CM | POA: Diagnosis not present

## 2020-06-02 DIAGNOSIS — R4020 Unspecified coma: Secondary | ICD-10-CM | POA: Diagnosis not present

## 2020-06-02 DIAGNOSIS — Z743 Need for continuous supervision: Secondary | ICD-10-CM | POA: Diagnosis not present

## 2020-06-04 ENCOUNTER — Ambulatory Visit: Payer: Medicare Other | Admitting: Skilled Nursing Facility1

## 2020-06-30 DIAGNOSIS — 419620001 Death: Secondary | SNOMED CT | POA: Diagnosis not present

## 2020-06-30 DEATH — deceased

## 2020-07-02 ENCOUNTER — Ambulatory Visit: Payer: Self-pay | Admitting: Psychology

## 2020-07-16 ENCOUNTER — Ambulatory Visit: Payer: Self-pay | Admitting: Psychology

## 2022-02-14 IMAGING — CT CT ANGIO CHEST
2 of 6 series · 18 of 36 positions shown · IV contrast (OMNIPAQUE 350)
Comparison: None.

CLINICAL DATA: Shortness of breath

EXAM:
CT ANGIOGRAPHY CHEST WITH CONTRAST
TECHNIQUE: Multidetector CT imaging of the chest was performed using the
standard protocol during bolus administration of intravenous
contrast. Multiplanar CT image reconstructions and MIPs were
obtained to evaluate the vascular anatomy.
CONTRAST:  100mL OMNIPAQUE IOHEXOL 350 MG/ML SOLN

[Series 5: thins · axial · 0.57mm/px · z∈[+1420,+1642]mm · 17 of 250 slices shown]
[im 14/250  lung]
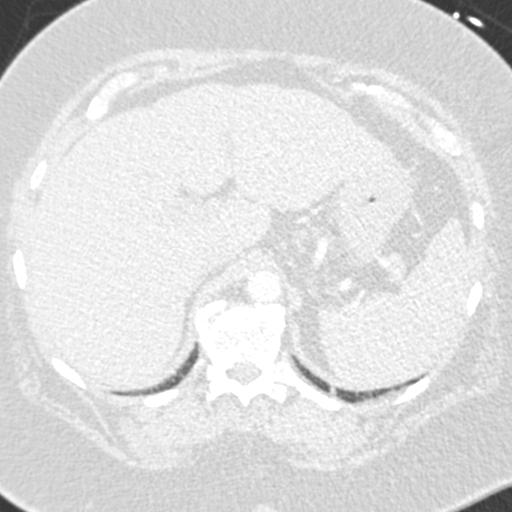
[im 28/250  mediastinal]
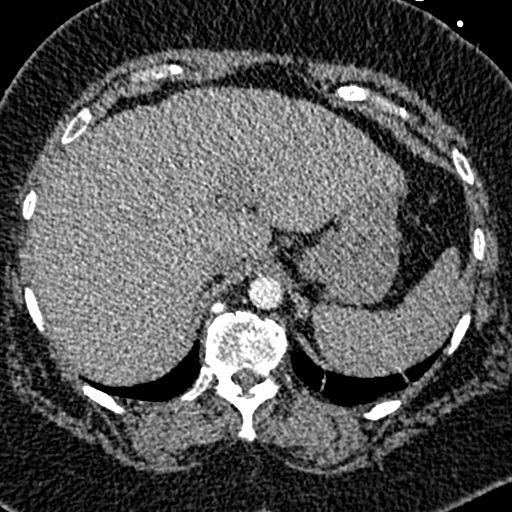
[im 42/250  lung]
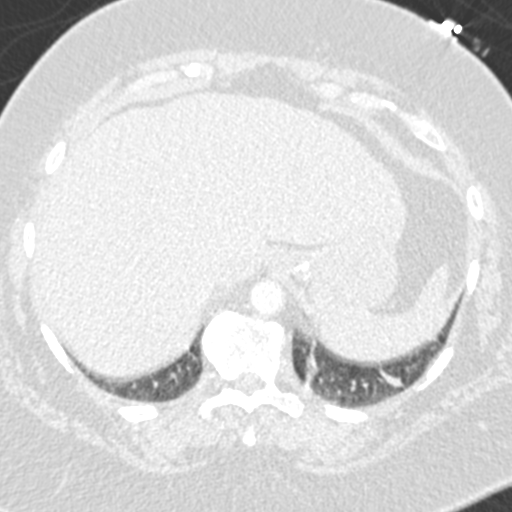
[im 56/250  mediastinal]
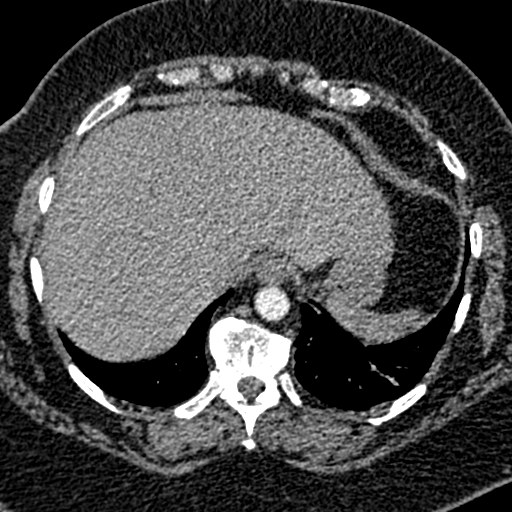
[im 70/250  lung]
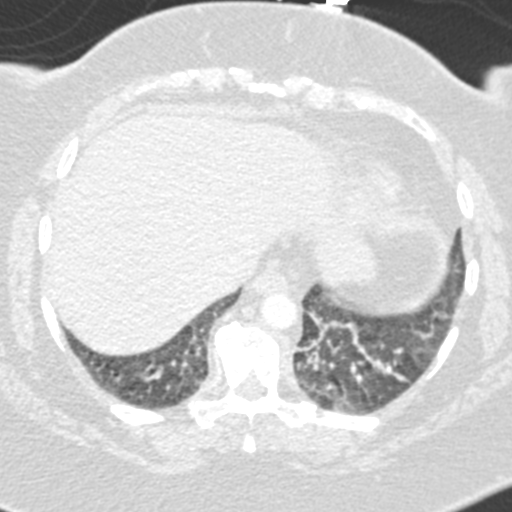
[im 84/250  mediastinal]
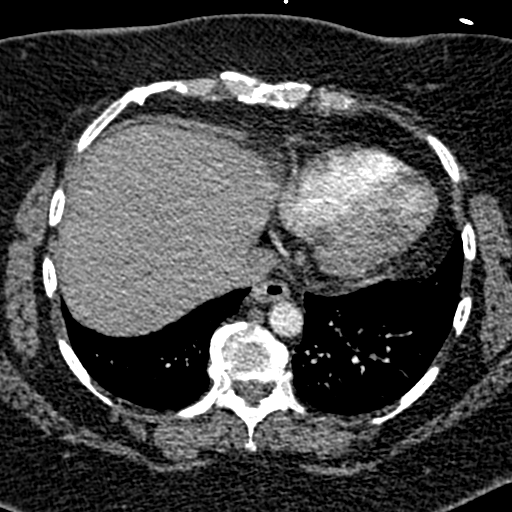
[im 97/250  lung]
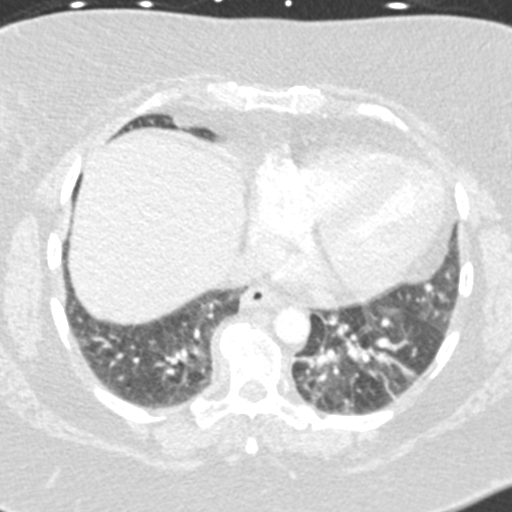
[im 111/250  mediastinal]
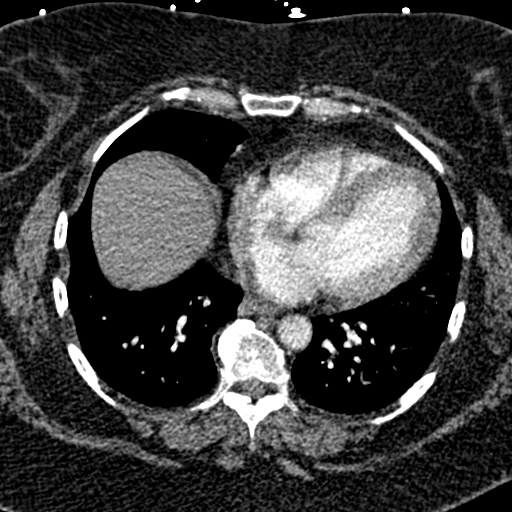
[im 125/250  lung]
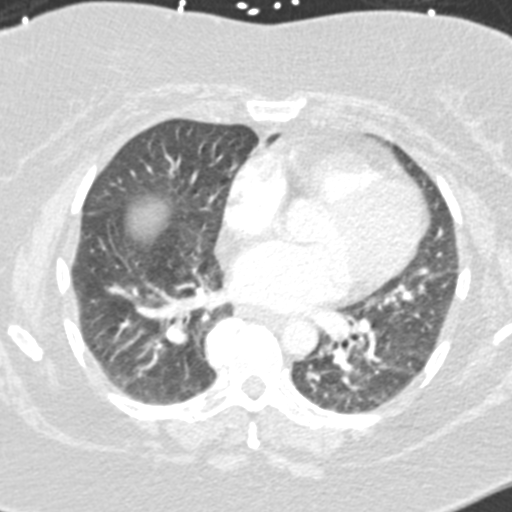
[im 139/250  mediastinal]
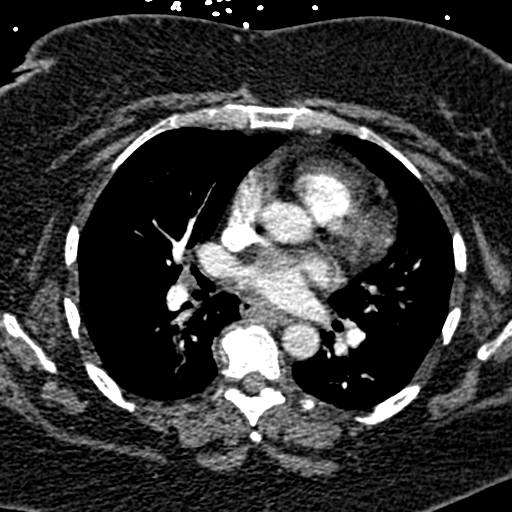
[im 153/250  lung]
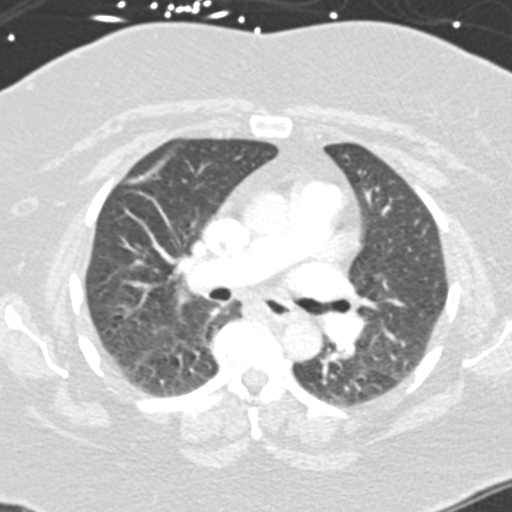
[im 167/250  mediastinal]
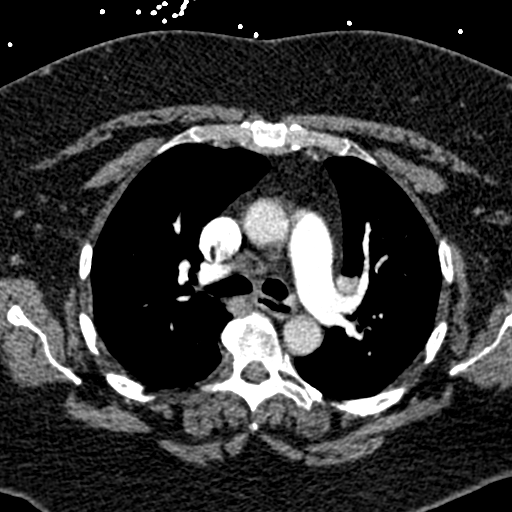
[im 180/250  lung]
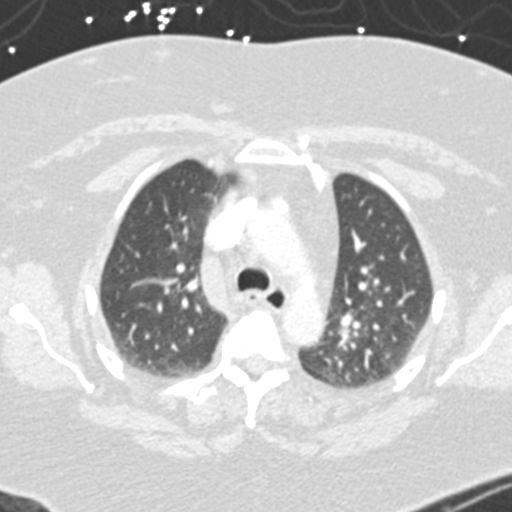
[im 194/250  mediastinal]
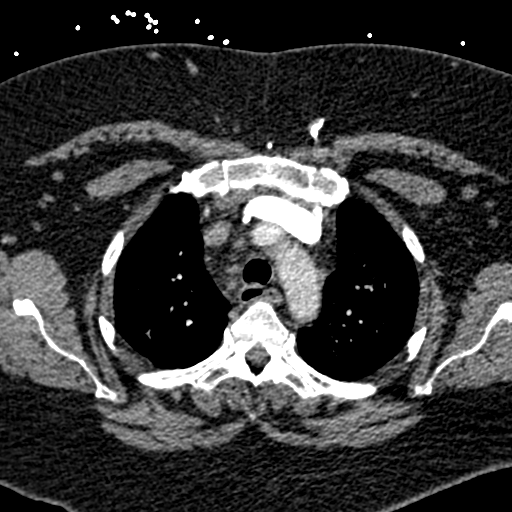
[im 208/250  lung]
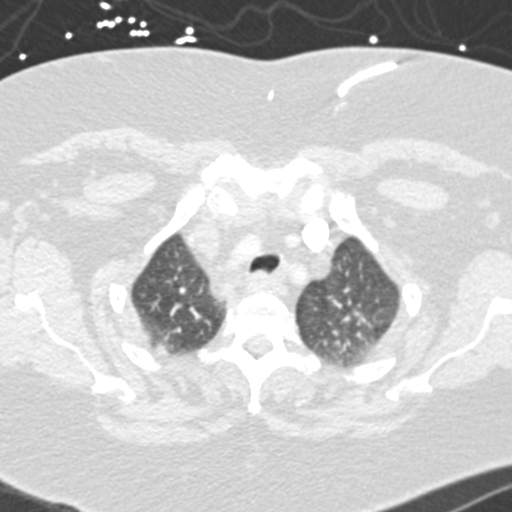
[im 222/250  mediastinal]
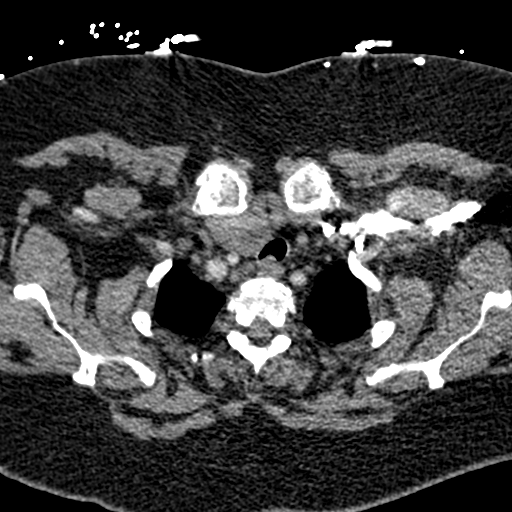
[im 236/250  lung]
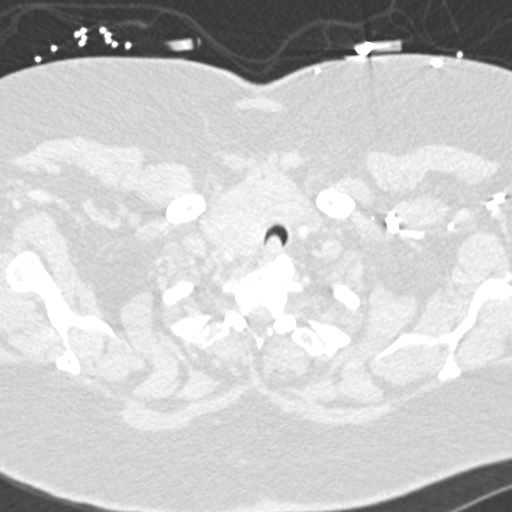

[Series 7: coronal mpr · coronal · 0.49mm/px · 1 of 151 slices shown]
[im 76/151  mediastinal]
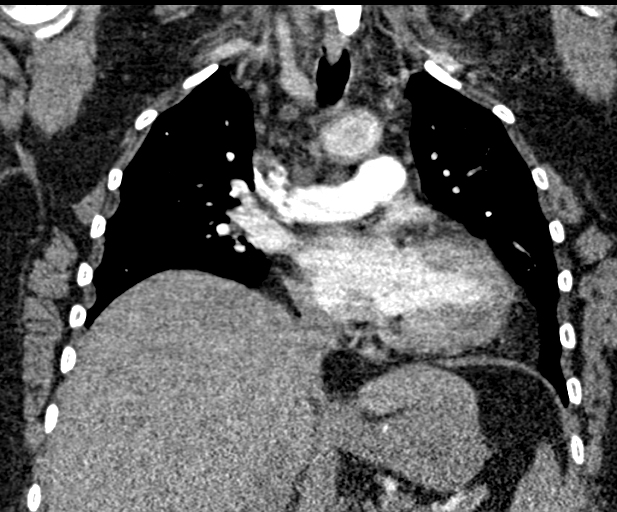

[18 of 36 positions shown; findings below may reference images not displayed]

FINDINGS: Cardiovascular: There is a optimal opacification of the pulmonary
arteries. There is no central,segmental, or subsegmental filling
defects within the pulmonary arteries. There is mild cardiomegaly.
No pericardial effusion or thickening. No evidence right heart
strain. There is normal three-vessel brachiocephalic anatomy without
proximal stenosis. Scattered aortic atherosclerosis is noted.

Mediastinum/Nodes: No hilar, mediastinal, or axillary adenopathy.
There is slight interval enlargement in the heterogeneous right
thyroid lobe now measuring 4 cm in transverse dimension. The
trachea, and esophagus demonstrate no significant findings.

Lungs/Pleura: Mild bibasilar hazy atelectasis at both lung bases. No
pleural effusion or pneumothorax. No airspace consolidation.

Upper Abdomen: No acute abnormalities present in the visualized
portions of the upper abdomen.

Musculoskeletal: No chest wall abnormality. No acute or significant
osseous findings.

Review of the MIP images confirms the above findings.
IMPRESSION: 1. No central, segmental, or subsegmental pulmonary embolism.
2. No other acute intrathoracic pathology to explain the patient's
symptoms.
3. Interval enlargement of the right thyroid lobe now measuring 4 cm
in transverse dimension. Recommend thyroid US (ref: [HOSPITAL]. [DATE]): 143-50).
4.  Aortic Atherosclerosis ([id number]).
